# Patient Record
Sex: Male | Born: 1950 | Race: White | Hispanic: No | Marital: Married | Smoking: Never smoker
Health system: Southern US, Community
[De-identification: ages and names within clinical notes are randomized; demographics above are authoritative.]

## PROBLEM LIST (undated history)

## (undated) DIAGNOSIS — M1990 Unspecified osteoarthritis, unspecified site: Secondary | ICD-10-CM

## (undated) DIAGNOSIS — R7611 Nonspecific reaction to tuberculin skin test without active tuberculosis: Secondary | ICD-10-CM

## (undated) DIAGNOSIS — R0600 Dyspnea, unspecified: Secondary | ICD-10-CM

## (undated) DIAGNOSIS — I472 Ventricular tachycardia, unspecified: Secondary | ICD-10-CM

## (undated) DIAGNOSIS — C61 Malignant neoplasm of prostate: Secondary | ICD-10-CM

## (undated) DIAGNOSIS — N529 Male erectile dysfunction, unspecified: Secondary | ICD-10-CM

## (undated) DIAGNOSIS — K5790 Diverticulosis of intestine, part unspecified, without perforation or abscess without bleeding: Secondary | ICD-10-CM

## (undated) DIAGNOSIS — A809 Acute poliomyelitis, unspecified: Secondary | ICD-10-CM

## (undated) DIAGNOSIS — R251 Tremor, unspecified: Secondary | ICD-10-CM

## (undated) HISTORY — PX: HIP ARTHROPLASTY: SHX981

## (undated) HISTORY — DX: Unspecified osteoarthritis, unspecified site: M19.90

## (undated) HISTORY — PX: CATARACT EXTRACTION, BILATERAL: SHX1313

## (undated) HISTORY — DX: Ventricular tachycardia: I47.2

## (undated) HISTORY — DX: Dyspnea, unspecified: R06.00

## (undated) HISTORY — PX: ROBOT ASSISTED LAPAROSCOPIC RADICAL PROSTATECTOMY: SHX5141

## (undated) HISTORY — PX: TONSILLECTOMY: SUR1361

## (undated) HISTORY — DX: Acute poliomyelitis, unspecified: A80.9

## (undated) HISTORY — PX: CARDIAC ELECTROPHYSIOLOGY STUDY AND ABLATION: SHX1294

## (undated) HISTORY — DX: Male erectile dysfunction, unspecified: N52.9

## (undated) HISTORY — DX: Ventricular tachycardia, unspecified: I47.20

## (undated) HISTORY — PX: TOTAL HIP ARTHROPLASTY: SHX124

## (undated) HISTORY — DX: Tremor, unspecified: R25.1

## (undated) HISTORY — PX: CATARACT EXTRACTION: SUR2

## (undated) HISTORY — DX: Diverticulosis of intestine, part unspecified, without perforation or abscess without bleeding: K57.90

## (undated) HISTORY — PX: OTHER SURGICAL HISTORY: SHX169

## (undated) HISTORY — DX: Malignant neoplasm of prostate: C61

## (undated) HISTORY — DX: Nonspecific reaction to tuberculin skin test without active tuberculosis: R76.11

---

## 1968-07-18 DIAGNOSIS — R7611 Nonspecific reaction to tuberculin skin test without active tuberculosis: Secondary | ICD-10-CM

## 1968-07-18 HISTORY — DX: Nonspecific reaction to tuberculin skin test without active tuberculosis: R76.11

## 2007-01-15 ENCOUNTER — Encounter: Payer: Self-pay | Admitting: Gastroenterology

## 2007-01-15 ENCOUNTER — Encounter: Payer: Self-pay | Admitting: Pulmonary Disease

## 2007-01-16 ENCOUNTER — Encounter: Payer: Self-pay | Admitting: Pulmonary Disease

## 2007-01-22 ENCOUNTER — Encounter: Payer: Self-pay | Admitting: Pulmonary Disease

## 2007-01-30 ENCOUNTER — Ambulatory Visit: Payer: Self-pay | Admitting: Internal Medicine

## 2007-02-03 ENCOUNTER — Inpatient Hospital Stay (HOSPITAL_COMMUNITY): Admission: AD | Admit: 2007-02-03 | Discharge: 2007-02-06 | Payer: Self-pay | Admitting: Internal Medicine

## 2007-02-03 ENCOUNTER — Encounter: Payer: Self-pay | Admitting: Pulmonary Disease

## 2007-02-03 ENCOUNTER — Ambulatory Visit: Payer: Self-pay | Admitting: Internal Medicine

## 2007-02-26 ENCOUNTER — Ambulatory Visit: Payer: Self-pay | Admitting: Internal Medicine

## 2007-03-06 ENCOUNTER — Encounter: Payer: Self-pay | Admitting: Internal Medicine

## 2007-03-06 ENCOUNTER — Ambulatory Visit: Payer: Self-pay | Admitting: Internal Medicine

## 2007-03-06 ENCOUNTER — Ambulatory Visit: Payer: Self-pay

## 2007-04-05 ENCOUNTER — Ambulatory Visit: Payer: Self-pay | Admitting: Internal Medicine

## 2007-04-05 ENCOUNTER — Ambulatory Visit (HOSPITAL_COMMUNITY): Admission: RE | Admit: 2007-04-05 | Discharge: 2007-04-06 | Payer: Self-pay | Admitting: Internal Medicine

## 2007-08-10 ENCOUNTER — Ambulatory Visit: Payer: Self-pay | Admitting: Internal Medicine

## 2007-08-10 ENCOUNTER — Ambulatory Visit: Payer: Self-pay

## 2007-08-10 ENCOUNTER — Encounter: Payer: Self-pay | Admitting: Internal Medicine

## 2008-03-28 ENCOUNTER — Ambulatory Visit: Payer: Self-pay | Admitting: Internal Medicine

## 2008-03-28 ENCOUNTER — Ambulatory Visit: Payer: Self-pay

## 2008-03-28 ENCOUNTER — Encounter: Payer: Self-pay | Admitting: Internal Medicine

## 2008-07-18 DIAGNOSIS — C61 Malignant neoplasm of prostate: Secondary | ICD-10-CM

## 2008-07-18 HISTORY — DX: Malignant neoplasm of prostate: C61

## 2008-11-14 ENCOUNTER — Encounter: Payer: Self-pay | Admitting: Gastroenterology

## 2008-11-18 ENCOUNTER — Encounter: Payer: Self-pay | Admitting: Gastroenterology

## 2008-11-25 ENCOUNTER — Telehealth: Payer: Self-pay | Admitting: Internal Medicine

## 2008-12-04 ENCOUNTER — Encounter: Payer: Self-pay | Admitting: Gastroenterology

## 2008-12-05 ENCOUNTER — Encounter: Payer: Self-pay | Admitting: Gastroenterology

## 2008-12-08 ENCOUNTER — Telehealth: Payer: Self-pay | Admitting: Internal Medicine

## 2008-12-11 DIAGNOSIS — I428 Other cardiomyopathies: Secondary | ICD-10-CM | POA: Insufficient documentation

## 2008-12-11 DIAGNOSIS — E785 Hyperlipidemia, unspecified: Secondary | ICD-10-CM

## 2008-12-19 ENCOUNTER — Ambulatory Visit: Payer: Self-pay | Admitting: Internal Medicine

## 2008-12-19 DIAGNOSIS — C61 Malignant neoplasm of prostate: Secondary | ICD-10-CM

## 2008-12-19 DIAGNOSIS — I493 Ventricular premature depolarization: Secondary | ICD-10-CM | POA: Insufficient documentation

## 2008-12-31 ENCOUNTER — Telehealth (INDEPENDENT_AMBULATORY_CARE_PROVIDER_SITE_OTHER): Payer: Self-pay | Admitting: *Deleted

## 2009-01-01 ENCOUNTER — Encounter: Payer: Self-pay | Admitting: Internal Medicine

## 2009-01-01 ENCOUNTER — Ambulatory Visit: Payer: Self-pay

## 2009-01-05 ENCOUNTER — Telehealth: Payer: Self-pay | Admitting: Internal Medicine

## 2009-01-06 ENCOUNTER — Encounter: Payer: Self-pay | Admitting: Internal Medicine

## 2009-01-28 ENCOUNTER — Encounter: Payer: Self-pay | Admitting: Gastroenterology

## 2009-02-04 ENCOUNTER — Encounter: Payer: Self-pay | Admitting: Gastroenterology

## 2009-02-06 ENCOUNTER — Encounter: Payer: Self-pay | Admitting: Internal Medicine

## 2009-02-09 ENCOUNTER — Encounter: Payer: Self-pay | Admitting: Internal Medicine

## 2009-02-09 ENCOUNTER — Encounter: Payer: Self-pay | Admitting: Gastroenterology

## 2009-05-08 ENCOUNTER — Encounter: Payer: Self-pay | Admitting: Gastroenterology

## 2009-06-03 ENCOUNTER — Encounter: Payer: Self-pay | Admitting: Internal Medicine

## 2009-06-05 ENCOUNTER — Ambulatory Visit: Payer: Self-pay | Admitting: Internal Medicine

## 2009-06-05 DIAGNOSIS — I452 Bifascicular block: Secondary | ICD-10-CM

## 2009-06-16 ENCOUNTER — Telehealth (INDEPENDENT_AMBULATORY_CARE_PROVIDER_SITE_OTHER): Payer: Self-pay | Admitting: *Deleted

## 2009-06-17 ENCOUNTER — Encounter: Payer: Self-pay | Admitting: Internal Medicine

## 2009-06-17 ENCOUNTER — Encounter: Payer: Self-pay | Admitting: Pulmonary Disease

## 2009-06-17 LAB — CONVERTED CEMR LAB
Albumin: 3.8 g/dL
Alkaline Phosphatase: 102 units/L
CO2: 24 meq/L
Calcium: 9.3 mg/dL
Creatinine, Ser: 1.1 mg/dL
HCT: 44 %
Potassium: 4.4 meq/L
RDW: 14.1 %
Sodium: 140 meq/L
WBC: 6.4 10*3/uL

## 2009-06-18 ENCOUNTER — Telehealth: Payer: Self-pay | Admitting: Internal Medicine

## 2009-06-19 ENCOUNTER — Ambulatory Visit: Payer: Self-pay | Admitting: Pulmonary Disease

## 2009-06-19 DIAGNOSIS — Z8612 Personal history of poliomyelitis: Secondary | ICD-10-CM

## 2009-06-25 ENCOUNTER — Encounter: Payer: Self-pay | Admitting: Pulmonary Disease

## 2009-06-25 ENCOUNTER — Ambulatory Visit: Admission: RE | Admit: 2009-06-25 | Discharge: 2009-06-25 | Payer: Self-pay | Admitting: Pulmonary Disease

## 2009-06-26 ENCOUNTER — Ambulatory Visit (HOSPITAL_COMMUNITY): Admission: RE | Admit: 2009-06-26 | Discharge: 2009-06-26 | Payer: Self-pay | Admitting: Pulmonary Disease

## 2009-07-01 ENCOUNTER — Telehealth: Payer: Self-pay | Admitting: Internal Medicine

## 2009-07-02 ENCOUNTER — Telehealth: Payer: Self-pay | Admitting: Pulmonary Disease

## 2009-07-02 ENCOUNTER — Encounter: Payer: Self-pay | Admitting: Pulmonary Disease

## 2009-07-07 ENCOUNTER — Encounter (INDEPENDENT_AMBULATORY_CARE_PROVIDER_SITE_OTHER): Payer: Self-pay | Admitting: *Deleted

## 2009-07-07 ENCOUNTER — Telehealth: Payer: Self-pay | Admitting: Internal Medicine

## 2009-07-07 ENCOUNTER — Encounter: Payer: Self-pay | Admitting: Internal Medicine

## 2009-07-16 ENCOUNTER — Telehealth (INDEPENDENT_AMBULATORY_CARE_PROVIDER_SITE_OTHER): Payer: Self-pay | Admitting: *Deleted

## 2009-08-07 ENCOUNTER — Ambulatory Visit: Payer: Self-pay | Admitting: Gastroenterology

## 2009-08-07 ENCOUNTER — Encounter (INDEPENDENT_AMBULATORY_CARE_PROVIDER_SITE_OTHER): Payer: Self-pay | Admitting: *Deleted

## 2009-08-07 DIAGNOSIS — R142 Eructation: Secondary | ICD-10-CM

## 2009-08-07 DIAGNOSIS — K5900 Constipation, unspecified: Secondary | ICD-10-CM | POA: Insufficient documentation

## 2009-08-07 DIAGNOSIS — R141 Gas pain: Secondary | ICD-10-CM

## 2009-08-07 DIAGNOSIS — R143 Flatulence: Secondary | ICD-10-CM

## 2009-08-07 LAB — CONVERTED CEMR LAB
AST: 24 units/L (ref 0–37)
Albumin: 3.9 g/dL (ref 3.5–5.2)
Alkaline Phosphatase: 77 units/L (ref 39–117)
Amylase: 128 units/L (ref 27–131)
CO2: 29 meq/L (ref 19–32)
Eosinophils Relative: 1.7 % (ref 0.0–5.0)
Glucose, Bld: 94 mg/dL (ref 70–99)
HCT: 44.7 % (ref 39.0–52.0)
Monocytes Relative: 11.4 % (ref 3.0–12.0)
Neutrophils Relative %: 62.2 % (ref 43.0–77.0)
Platelets: 194 10*3/uL (ref 150.0–400.0)
Potassium: 4.1 meq/L (ref 3.5–5.1)
Saturation Ratios: 22 % (ref 20.0–50.0)
Sed Rate: 15 mm/hr (ref 0–22)
Sodium: 141 meq/L (ref 135–145)
Total Protein: 7.2 g/dL (ref 6.0–8.3)
WBC: 4.5 10*3/uL (ref 4.5–10.5)

## 2009-08-14 ENCOUNTER — Ambulatory Visit: Payer: Self-pay | Admitting: Pulmonary Disease

## 2009-08-21 ENCOUNTER — Encounter: Payer: Self-pay | Admitting: Gastroenterology

## 2009-08-28 ENCOUNTER — Ambulatory Visit: Payer: Self-pay | Admitting: Gastroenterology

## 2009-09-01 ENCOUNTER — Encounter: Payer: Self-pay | Admitting: Gastroenterology

## 2009-09-14 ENCOUNTER — Telehealth (INDEPENDENT_AMBULATORY_CARE_PROVIDER_SITE_OTHER): Payer: Self-pay | Admitting: *Deleted

## 2009-09-14 ENCOUNTER — Encounter: Payer: Self-pay | Admitting: Gastroenterology

## 2009-11-26 ENCOUNTER — Telehealth: Payer: Self-pay | Admitting: Internal Medicine

## 2009-12-03 ENCOUNTER — Telehealth: Payer: Self-pay | Admitting: Internal Medicine

## 2009-12-07 ENCOUNTER — Encounter: Payer: Self-pay | Admitting: Internal Medicine

## 2009-12-17 ENCOUNTER — Ambulatory Visit: Payer: Self-pay | Admitting: Internal Medicine

## 2009-12-24 ENCOUNTER — Telehealth: Payer: Self-pay | Admitting: Internal Medicine

## 2010-01-01 ENCOUNTER — Encounter: Payer: Self-pay | Admitting: Internal Medicine

## 2010-02-12 ENCOUNTER — Telehealth: Payer: Self-pay | Admitting: Internal Medicine

## 2010-07-01 ENCOUNTER — Encounter: Payer: Self-pay | Admitting: Internal Medicine

## 2010-07-01 ENCOUNTER — Ambulatory Visit: Payer: Self-pay | Admitting: Internal Medicine

## 2010-08-18 NOTE — Progress Notes (Signed)
Summary: c/o still having pvc   Phone Note Call from Patient Call back at Home Phone [phone]   Caller: Patient Reason for Call: Talk to Nurse Summary of Call: Per pt calling back to check on status of ekg that was faxed over on 5/12. c/o still having pvc.  Initial call taken by: Lorne Skeens,  Dec 03, 2009 10:12 AM  Follow-up for Phone Call        pt calling again, request call back,Jennifer Wilmon Pali  Dec 03, 2009 4:45 PM   per Dr Graciela Husbands he reviewed EKG pt did have some PVCs, ask how pt is feeling if is symptomatic can be seen at some point, called pt and left mess to call back Meredith Staggers, RN  Dec 03, 2009 5:13 PM   I attempted to call the pt. I left a message to c/b. Sherri Rad, RN, BSN  Dec 04, 2009 2:00 PM   The pt called back today. He states he feels "chronic fatigue" with his palpitations. This has been worse over the last 2 weeks. I explained Dr. Odessa Fleming findings on his EKG and that [name] would see him back in the office. The pt wonders if he just needs to wear a heart monitor again. He last wore one starting 06/26/09. I stated insurance may not pay for this until 6 months out from his previous monitor. I asked the pt if he is drinking any caffeine. He states he drinks a cup of caffeinated coffee every morning. I advised the pt to refrain from this over the weekend and then to call us on monday and let us know how he is feeling. He is agreeable with this. He did state that his bp is 130/80 and his HR is 60-70. He did explain that he has had problems with bradycardia on cardiac drugs in the past, but he does not recall what he took. When we see how he is monday, we will decide what the next step should be. Follow-up by: Sherri Rad, RN, BSN,  Dec 04, 2009 3:59 PM  Additional Follow-up for Phone Call Additional follow up Details #1::        I called the pt today to f/u on his symptoms. He was not home, but I spoke with his wife and she states he was still having some  symptoms. He may go to his PCP today during lunch to see if they will place a 24 hour holter on him. I advised the pt's wife to please have him call us if he has this placed. She verbalizes understanding.  Additional Follow-up by: Sherri Rad, RN, BSN,  Dec 07, 2009 11:41 AM    Additional Follow-up for Phone Call Additional follow up Details #2::    The pt called back. He states he is still having symptoms and he would like to wear a 24 hour holter. He states that Dr. Stephan Minister office will do this for him if we can send him an order. I will review w/ Amber-RN for [name] to see if this is ok. Dr. Stephan Minister office # is [phone]. Sherri Rad, RN, BSN  Dec 07, 2009 11:56 AM   Per Joice Lofts- ok to send order. Sherri Rad, RN, BSN  Dec 07, 2009 1:28 PM   Order faxed to Dr. Stephan Minister office at [phone] for a 24 hour holter. I called the pt at his work # and left a message that his order has been faxed to Dr. Stephan Minister office.  Follow-up by:  Sherri Rad, RN, BSN,  Dec 07, 2009 1:38 PM

## 2010-08-18 NOTE — Letter (Signed)
Summary: [facility]  [facility]   Imported By: Sherian Rein 09/07/2009 11:04:31  _____________________________________________________________________  External Attachment:    Type:   Image     Comment:   External Document

## 2010-08-18 NOTE — Medication Information (Signed)
Summary: 24 hr Holter Order  24 hr Holter Order   Imported By: Marylou Mccoy 01/19/2010 12:56:51  _____________________________________________________________________  External Attachment:    Type:   Image     Comment:   External Document

## 2010-08-18 NOTE — Letter (Signed)
Summary: [facility]  [facility]   Imported By: Sherian Rein 09/07/2009 11:01:32  _____________________________________________________________________  External Attachment:    Type:   Image     Comment:   External Document

## 2010-08-18 NOTE — Letter (Signed)
Summary: [facility]  [facility]   Imported By: Sherian Rein 09/07/2009 11:03:25  _____________________________________________________________________  External Attachment:    Type:   Image     Comment:   External Document

## 2010-08-18 NOTE — Miscellaneous (Signed)
Summary: Dexilant Rx  Clinical Lists Changes  Medications: Changed medication from DEXILANT 60 MG CPDR (DEXLANSOPRAZOLE) 1 by mouth qd to DEXILANT 60 MG CPDR (DEXLANSOPRAZOLE) 1 by mouth every morning - Signed Rx of DEXILANT 60 MG CPDR (DEXLANSOPRAZOLE) 1 by mouth every morning;  #30 x 6;  Signed;  Entered by: Jennye Boroughs RN;  Authorized by: Mardella Layman MD [facility];  Method used: Electronically to [facility] [phone]*, [address] [place], [place]  [address], Ph: [phone] or [phone], Fax: [phone]    Prescriptions: DEXILANT 60 MG CPDR (DEXLANSOPRAZOLE) 1 by mouth every morning  #30 x 6   Entered by:   Jennye Boroughs RN   Authorized by:   Mardella Layman MD [facility]   Signed by:   Jennye Boroughs RN on 08/28/2009   Method used:   Electronically to        [facility] [address] [phone]* (retail)       [address]       [place], [place]  [address]       Ph: [phone] or [phone]       Fax: [phone]   RxID:   [id number]

## 2010-08-18 NOTE — Letter (Signed)
Summary: [facility]  [facility]   Imported By: Sherian Rein 09/07/2009 11:05:51  _____________________________________________________________________  External Attachment:    Type:   Image     Comment:   External Document

## 2010-08-18 NOTE — Assessment & Plan Note (Signed)
Summary: PER CHECK OUT/SF   Visit Type:  Follow-up Referring Provider:  Berton Mount, Sheryn Bison Primary Provider:  Jerene Pitch, MD   CC:  None.  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 Take One Capsule Daily 2)  Dexilant 60 Mg Cpdr (Dexlansoprazole) .Marland Kitchen.. 1 By Mouth Every Morning 3)  Cialis 5 Mg Tabs (Tadalafil) .... As Needed  Allergies (verified): 1)  ! Sotalol Hcl 2)  ! Flecainide Acetate 3)  ! 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  Alcohol Use - no Positive PPD in the 1970's. Exposed in [place] Daily Caffeine Use: one daily   Vital Signs:  Patient profile:   60 year old male Height:      72 inches Weight:      204 pounds BMI:     27.77 Pulse rate:   67 / minute Pulse rhythm:   regular BP sitting:   120 / 80  (left arm) Cuff size:   regular  Vitals  Entered By: Judithe Modest CMA (December 17, 2009 1:00 PM)  Physical Exam  General:  The patient was alert and oriented in no acute distress. HEENT Normal.  Neck veins were flat, carotids were brisk.  Lungs were clear.  Heart sounds were regular without murmurs or gallops.  Abdomen was soft with active bowel sounds. There is no clubbing cyanosis or edema. Skin Warm and dry    EKG  Procedure date:  12/17/2009  Findings:      sinus with PVC  LBBB sup axis RBBB  Impression & Recommendations:  Problem # 1:  PREMATURE VENTRICULAR CONTRACTIONS S/P RFCA (ICD-427.69) He has recurrent PVC we will quantitate them via a monitor and consider referral to [facility]  for repeat RFCA    Problem # 2:  PALPITATIONS, RECURRENT (ICD-785.1) It is not clear the mechanism of htese tachypalps and tremors  we will use a patinet activeted montior  Problem # 3:  CARDIOMYOPATHY (ICD-425.4) resolved  Other Orders: EKG w/ Interpretation (93000)

## 2010-08-18 NOTE — Assessment & Plan Note (Signed)
Summary: f/u test results/pft and sniff test/LC   Copy to:  Berton Mount, Sheryn Bison Primary Provider/Referring Provider:  Jerene Pitch, MD   CC:  Patient here to follow-upon PFT and sniff test..  History of Present Illness: 60 yo male for follow up of dyspnea.  He has been feeling about the same.  He was recently seen by GI, and was started on a new anti-reflux medication.  He thinks this may have helped some, but he has only been on the new medication for about one week.  Chest xray report from 01/15/2007 negative, from 01/22/2007 negative, from 02/03/2007 negative.  Report CT chest with PE protocol from 01/16/2007 no PE, trace basilar atelectasis.  Chest xray from 06/17/2009 negative.  SNIFF test was normal.  PFT's were normal.  Current Medications (verified): 1)  Vitamin C Cr 500 Mg Cr-Caps (Ascorbic Acid) .... Take One Capsule Daily 2)  Dexilant 60 Mg Cpdr (Dexlansoprazole) .Marland Kitchen.. 1 By Mouth Qd 3)  Librax 2.5-5 Mg Caps (Clidinium-Chlordiazepoxide) .Marland Kitchen.. 1 By Mouth Three Times A Day Prn 4)  Cialis 5 Mg Tabs (Tadalafil) .Marland Kitchen.. 1 By Mouth Daily  Allergies (verified): 1)  ! Sotalol Hcl 2)  ! Flecainide Acetate 3)  ! * Rythmol  Past History:  Past Medical History: Reviewed history from 06/19/2009 and no changes required. 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 ablation radical prostate robotic cataract  Vital Signs:  Patient profile:   60 year old male Height:      72 inches (182.88 cm) Weight:      208.13 pounds (94.60 kg) BMI:     28.33 O2 Sat:      98 % on Room air Temp:     98.0  degrees F (36.67 degrees C) oral Pulse rate:   79 / minute BP sitting:   104 / 68  (left arm) Cuff size:   regular  Vitals Entered By: Michel Bickers CMA (August 14, 2009 2:50 PM)  O2 Sat at Rest %:  98 O2 Flow:  Room air  Physical Exam  General:  normal appearance and healthy appearing.   Nose:  no deformity, discharge, inflammation, or lesions Mouth:  no deformity or lesions Neck:  no masses, thyromegaly, or abnormal cervical nodes Lungs:  clear bilaterally to auscultation and percussion Heart:  regular rate and rhythm, S1, S2 without murmurs, rubs, gallops, or clicks, with occasional PVC's Abdomen:  bowel sounds positive; abdomen soft and non-tender without masses, or organomegaly Extremities:  no clubbing, cyanosis, edema, or deformity noted Cervical Nodes:  no significant adenopathy   Impression & Recommendations:  Problem # 1:  DYSPNEA-RECUMBENT (ICD-786.05)  His pulmonary evaluation has been unremarkable to date.  There is concern that he may be having reflux contributing to upper airway irritation and causing his dyspnea.  Will give more time with new anti-reflux medication, and reassess his symptoms status.  If he is no better will need CT chest imaging.  Medications Added to Medication List This Visit: 1)  Cialis 5 Mg Tabs (Tadalafil) .Marland Kitchen.. 1 by mouth daily  Complete Medication List: 1)  Vitamin C Cr 500 Mg Cr-caps (Ascorbic acid) .... Take one capsule daily 2)  Dexilant 60 Mg Cpdr (Dexlansoprazole) .Marland Kitchen.. 1 by  mouth qd 3)  Librax 2.5-5 Mg Caps (Clidinium-chlordiazepoxide) .Marland Kitchen.. 1 by mouth three times a day prn 4)  Cialis 5 Mg Tabs (Tadalafil) .Marland Kitchen.. 1 by mouth daily  Other Orders: Est. Patient Level III ([address])  Patient Instructions: 1)  Follow up in 3 months

## 2010-08-18 NOTE — Progress Notes (Signed)
Summary: Dexilant approval   Phone Note Outgoing Call   Call placed by: Ashok Cordia RN,  September 14, 2009 9:45 AM Summary of Call: Prior authorization needed for [employer].  Dexilant approved for one year.  Pharmacy notified.  LM for pt to call.  Initial call taken by: Ashok Cordia RN,  September 14, 2009 9:48 AM  Follow-up for Phone Call        LM that dexilant has been approved and to call if any questions. Follow-up by: Ashok Cordia RN,  September 14, 2009 4:25 PM

## 2010-08-18 NOTE — Cardiovascular Report (Signed)
Summary: [facility] Report  [facility] Report   Imported By: Kassie Mends 09/29/2009 09:46:45  _____________________________________________________________________  External Attachment:    Type:   Image     Comment:   External Document

## 2010-08-18 NOTE — Letter (Signed)
Summary: [facility]  [facility]   Imported By: Sherian Rein 09/07/2009 11:06:49  _____________________________________________________________________  External Attachment:    Type:   Image     Comment:   External Document

## 2010-08-18 NOTE — Procedures (Signed)
Summary: Summary Report  Summary Report   Imported By: Erle Crocker 02/25/2010 12:40:27  _____________________________________________________________________  External Attachment:    Type:   Image     Comment:   External Document

## 2010-08-18 NOTE — Letter (Signed)
Summary: [facility]  [facility]   Imported By: Sherian Rein 08/31/2009 10:31:49  _____________________________________________________________________  External Attachment:    Type:   Image     Comment:   External Document

## 2010-08-18 NOTE — Procedures (Signed)
Summary: Upper Endoscopy  Patient: Derek Cervantes Note: All result statuses are Final unless otherwise noted.  Tests: (1) Upper Endoscopy (EGD)   EGD Upper Endoscopy       DONE     [facility]     [address]     [place], [place]  [address]           ENDOSCOPY PROCEDURE REPORT           PATIENT:  [name], [name]  MR#:  [id number]     BIRTHDATE:  August 14, 1950, 58 yrs. old  GENDER:  male           ENDOSCOPIST:  Vania Rea. Jarold Motto, MD, [facility]     Referred by:           PROCEDURE DATE:  08/28/2009     PROCEDURE:  EGD with biopsy     ASA CLASS:  Class II     INDICATIONS:  hoarseness and chest pressure.R/O GERD           MEDICATIONS:   Fentanyl 50 mcg IV, Versed 5 mg IV     TOPICAL ANESTHETIC:  Exactacain Spray           DESCRIPTION OF PROCEDURE:   After the risks benefits and     alternatives of the procedure were thoroughly explained, informed     consent was obtained.  The Pentax [id number] endoscope was     introduced through the mouth and advanced to the second portion of     the duodenum, without limitations.  The instrument was slowly     withdrawn as the mucosa was fully examined.     <<PROCEDUREIMAGES>>           Normal duodenal folds were noted. NODULAR BULB C/W BRUNNER'S GLAND     HYPERPLASIA,BIOPSIES OF DONE OF SECOND PORTION OF DUODENUM.  The     stomach was entered and closely examined. The antrum, angularis,     and lesser curvature were well visualized, including a retroflexed     view of the cardia and fundus. The stomach wall was normally     distensable. The scope passed easily through the pylorus into the     duodenum. RANDOM BIOPSIES FOR H.PYLORI DONE.  The esophagus and     gastroesophageal junction were completely normal in appearance. ??     DISTAL WEB.MULTIPLE BIOPSIES DONE.    Retroflexed views revealed     no masses.    The scope was then withdrawn from the patient and     the procedure completed.           COMPLICATIONS:  None           ENDOSCOPIC  IMPRESSION:     1) Normal duodenal folds     2) Normal stomach     3) Normal esophagus     4) No masses     1.R/O CELIAC DISEASE     2.R/O EOSINOPHILIC ESOPHAGITIS.     3.PROBABLE GERD BETTER ON PPI RX.     RECOMMENDATIONS:     1) await biopsy results     2) continue current medications           REPEAT EXAM:  No           ______________________________     Vania Rea. Jarold Motto, MD, [facility]           CC:  Adele Barthel MD  n.     eSIGNED:   Vania Rea. Kemp Gomes at 08/28/2009 03:16 PM           Dionisio Kerrie Latour, [id number]  Note: An exclamation mark (!) indicates a result that was not dispersed into the flowsheet. Document Creation Date: 08/28/2009 3:16 PM _______________________________________________________________________  (1) Order result status: Final Collection or observation date-time: 08/28/2009 15:03 Requested date-time:  Receipt date-time:  Reported date-time:  Referring Physician:   Ordering Physician: Sheryn Bison [phone]) Specimen Source:  Source: Launa Grill Order Number: [id number] Lab site:

## 2010-08-18 NOTE — Letter (Signed)
Summary: [facility]  [facility]   Imported By: Sherian Rein 09/07/2009 11:02:30  _____________________________________________________________________  External Attachment:    Type:   Image     Comment:   External Document

## 2010-08-18 NOTE — Assessment & Plan Note (Signed)
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 He is doing well from a cardiac standpoint and is on no antiarrhythmic medications. His main complaint today is 2 years of postprandial bloating with vague recurrent discomfort in his substernal area without true reflux symptoms. He recently has noticed some hoarseness ,and he has episodes of stress induced tremors in his chest and abdomen with associated shortness of breath suggestive of possible panic attacks. He does admit that he has been very stressed by recent robotic surgery for stage III prostate carcinoma apparently without evidence of metastasis per CT scan. Apparently his surgery was performed in [facility], and he relates his CT scan of the abdomen was performed and was normal. He denies burning substernal chest pain, regurgitation, or dysphagia. He denies hepatobiliary complaints or history of hepatitis or pancreatitis. He is having regular bowel movements without melena or hematochezia. He had a negative screening colonoscopy by Dr. Tressia Danas in [place] in 2007. Apparently he did have diverticulosis noted. He denies melena, hematochezia, any history of hepatitis, anemia, or systemic complaints such as fever, chills, skin rashes, joint pains et Karie Soda. He denies clinically significant depression. He has not had previous endoscopic exam or barium studies   GI Review of Systems    Reports abdominal pain and   bloating.     Location of  Abdominal pain: epigastric area.    Denies acid reflux, belching, chest pain, dysphagia with liquids, dysphagia with solids, heartburn, loss of appetite, nausea, vomiting, vomiting blood, weight loss, and  weight gain.      Reports constipation.     Denies anal fissure, black tarry stools, change in bowel habit, diarrhea, diverticulosis, fecal incontinence, heme positive stool, hemorrhoids, irritable bowel syndrome, jaundice, light color stool, liver problems, rectal bleeding, and  rectal pain.    Current Medications (verified): 1)  Vitamin C Cr 500 Mg Cr-Caps (Ascorbic Acid) .... Take One Capsule Daily 2)  Prilosec Otc 20 Mg Tbec (Omeprazole Magnesium) .... Take 1 Tablet By Mouth Once A Day As Needed  Allergies (verified): 1)  ! Sotalol Hcl 2)  ! Flecainide Acetate 3)  ! * Rythmol  Past History:  Past medical, surgical, family and social histories (including risk factors) reviewed for relevance to current acute and chronic problems.  Past Medical History: Reviewed history from 06/19/2009 and no changes required. Idiopathic VT (ventricular tachycardia) status post ablation at the [facility] clinic Cardiomyopathy - PVC (premature ventricular contraction) related prostate cancer status post robotic surgery-stage III Recumbent dyspnea Hand tremors unassociated with palpitations Polio as  a child Diverticulosis PPD in 1970's Erectile dysfunction  Past Surgical History: Reviewed history from 06/19/2009 and no changes required. ablation radical prostate robotic cataract  Family History: Reviewed history from 12/11/2008 and no changes required. MGM died of CAD in her 85s Bladder Cancer: Father  No FH of Colon Cancer:  Social History: Reviewed history from 06/19/2009 and no changes required. Full Time-Dentist Married Childern Tobacco Use - No.  Alcohol Use - no Positive PPD in the 1970's. Exposed in  [place] Daily Caffeine Use: one daily   Review  of Systems General:  Complains of fatigue and sleep disorder; denies fever, chills, sweats, anorexia, weakness, malaise, and weight loss. Eyes:  Denies blurring, diplopia, irritation, discharge, vision loss, scotoma, eye pain, and photophobia. ENT:  Complains of hoarseness; denies earache, ear discharge, tinnitus, decreased hearing, nasal congestion, loss of smell, nosebleeds, sore throat, and difficulty swallowing. CV:  Complains of chest pains and palpitations; denies angina, syncope, dyspnea on exertion, orthopnea, PND, peripheral edema, and claudication; Cardiac ablation in September 2008 in July 2009. He been followed by [name] and our cardiology division.Marland Kitchen Resp:  Complains of dyspnea with exercise; denies dyspnea at rest, cough, sputum, wheezing, coughing up blood, and pleurisy; He Recently had been evaluated by pulmonary division and had normal pulmonary function test. There was some question of reactive airways disease. However, there is no evidence of restrictive disease or severe asthma.. GI:  Complains of constipation; denies difficulty swallowing, pain on swallowing, nausea, indigestion/heartburn, vomiting, vomiting blood, abdominal pain, jaundice, gas/bloating, diarrhea, change in bowel habits, bloody BM's, black BMs, and fecal incontinence. GU:  Complains of urinary incontinence; denies urinary burning, blood in urine, urinary frequency, urinary hesitancy, nocturnal urination, penile discharge, genital sores, decreased libido, and erectile dysfunction; prostatectomy in July 2010 by robotic surgery.. MS:  Denies joint pain / LOM, joint swelling, joint stiffness, joint deformity, low back pain, muscle weakness, muscle cramps, muscle atrophy, leg pain at night, leg pain with exertion, and shoulder pain / LOM hand / wrist pain (CTS). Derm:  Denies rash, itching, dry skin, hives, moles, warts, and unhealing ulcers. Neuro:  Denies weakness, paralysis, abnormal sensation, seizures, syncope,  tremors, vertigo, transient blindness, frequent falls, frequent headaches, difficulty walking, headache, sciatica, radiculopathy other:, restless legs, memory loss, and confusion; He apparently had polio as a child but has had no subsequent neuromuscular difficulties.Marland Kitchen Psych:  Complains of anxiety; denies depression, memory loss, suicidal ideation, hallucinations, paranoia, phobia, and confusion. Endo:  Denies cold intolerance, heat intolerance, polydipsia, polyphagia, polyuria, unusual weight change, and hirsutism. Heme:  Denies bruising, bleeding, enlarged lymph nodes, and pagophagia. Allergy:  Denies hives, rash, sneezing, hay fever, and recurrent infections.  Vital Signs:  Patient profile:   60 year old male Height:      72 inches Weight:      201 pounds BMI:     27.36 BSA:     2.14 Pulse rate:   72 / minute Pulse rhythm:   regular BP sitting:   118 / 80  (left arm) Cuff size:   regular  Vitals Entered By: Ok Anis CMA (August 07, 2009 9:45 AM)  Physical Exam  General:  Well developed, well nourished, no acute distress.healthy appearing.   Head:  Normocephalic and atraumatic. Eyes:  PERRLA, no icterus.exam deferred to patient's ophthalmologist.   Mouth:  No deformity or lesions, dentition normal. Neck:  Supple; no masses or thyromegaly. Lungs:  Clear throughout to auscultation. Heart:  Regular rate and rhythm; no murmurs, rubs,  or bruits.He appears be a regular rhythm without ectopic beats noted at this time Abdomen:  Soft, nontender and nondistended. No masses, hepatosplenomegaly or hernias noted. Normal bowel sounds. Rectal:  deferred Msk:  Symmetrical with no gross deformities. Normal posture. Pulses:  Normal pulses noted. Extremities:  No clubbing, cyanosis, edema or deformities noted. Neurologic:  Alert and  oriented x4;  grossly normal neurologically. Cervical Nodes:  No significant cervical adenopathy. Axillary Nodes:  No significant axillary adenopathy. Inguinal  Nodes:  No significant  inguinal adenopathy. Psych:  Alert and cooperative. Normal mood and affect.   Impression & Recommendations:  Problem # 1:  ABDOMINAL BLOATING (ICD-787.3) Assessment Unchanged Probable IBS with an element of delayed gastric emptying. We had scheduled endoscopy to exclude H. pylori infection. Screening labs have also been requested. I placed him on p.r.n. Librax pending further workup. Orders: TLB-CBC Platelet - w/Differential (85025-CBCD) TLB-BMP (Basic Metabolic Panel-BMET) (80048-METABOL) TLB-Hepatic/Liver Function Pnl (80076-HEPATIC) TLB-TSH (Thyroid Stimulating Hormone) (84443-TSH) TLB-B12, Serum-Total ONLY (16109-U04) TLB-Ferritin (82728-FER) TLB-Folic Acid (Folate) (82746-FOL) TLB-IBC Pnl (Iron/FE;Transferrin) (83550-IBC) TLB-Amylase (82150-AMYL) TLB-Lipase (83690-LIPASE) TLB-Sedimentation Rate (ESR) (85652-ESR) EGD (EGD)  Problem # 2:  CONSTIPATION (ICD-564.00) Assessment: Unchanged High fiber diet as tolerated with liberal p.o. fluids  Problem # 3:  POSITIVE PPD (ICD-795.5) Assessment: Comment Only No evidence of active tuberculosis.  Problem # 4:  POLIOMYELITIS, HX OF (ICD-V12.02) Assessment: Comment Only No Evidence of diffuse neuromuscular disease at this time.  Problem # 5:  DYSPNEA-RECUMBENT (ICD-786.05) Assessment: Improved Possibly related to occult acid reflux as is his complaint of new onset hoarseness. I placed him on BETTER ACID SUPPRESSION WITH Dexilant 60 mg 30 minutes before breakfast along with standard anti-reflux maneuvers. Again, endoscopic exam with esophageal, gastric, and duodenal biopsies has been scheduled  Problem # 6:  ADENOCARCINOMA, PROSTATE (ICD-185) Assessment: Improved Continued urologic followup as scheduled  Problem # 7:  PREMATURE VENTRICULAR CONTRACTIONS S/P RFCA (ICD-427.69) Assessment: Improved Continued cardiology followup as scheduled  Patient Instructions: 1)  Copy sent to : Dr. Adele Barthel and Dr.  Sherryl Manges and Dr. Craige Cotta in pulmonary 2)  Please continue current medications.  3)  Avoid foods high in acid content ( tomatoes, citrus juices, spicy foods) . Avoid eating within 3 to 4 hours of lying down or before exercising. Do not over eat; try smaller more frequent meals. Elevate head of bed four inches when sleeping.  4)  Excessive Gas Diet handout given.  5)  Conscious Sedation brochure given.  6)  Upper Endoscopy brochure given.  7)  Dexilant 60 mg 30 minutes before breakfast along with p.r.n. Librax use. 8)  Diet should be high in fiber ( fruits, vegetables, whole grains) but low in residue. Drink at least eight (8) glasses of water a day.  9)  Labs Pending 10)  The medication list was reviewed and reconciled.  All changed / newly prescribed medications were explained.  A complete medication list was provided to the patient / caregiver. Prescriptions: LIBRAX 2.5-5 MG CAPS (CLIDINIUM-CHLORDIAZEPOXIDE) 1 by mouth three times a day prn  #60 x 2   Entered by:   Ashok Cordia RN   Authorized by:   Mardella Layman MD [facility]   Signed by:   Ashok Cordia RN on 08/07/2009   Method used:   Electronically to        [facility] [phone] (retail)       [address]       [place], [place]  [address]       Ph: [phone] or [phone]       Fax: [phone]   RxID:   [id number]

## 2010-08-18 NOTE — Letter (Signed)
Summary: EGD Instructions  [facility]  [address] [place], [place] [address]   Phone: [phone]  Fax: [phone]       Derek Cervantes    03/11/1951    MRN: [id number]       Procedure Day /Date: Friday, 08/28/09     Arrival Time:  2:00     Procedure Time: 3:00     Location of Procedure:                    [facility] (4th Floor)    PREPARATION FOR ENDOSCOPY   On 08/28/09 THE DAY OF THE PROCEDURE:  1.   No solid foods, milk or milk products are allowed after midnight the night before your procedure.  2.   Do not drink anything colored red or purple.  Avoid juices with pulp.  No orange juice.  3.  You may drink clear liquids until 1:00, which is 2 hours before your procedure.                                                                                                CLEAR LIQUIDS INCLUDE: Water Jello Ice Popsicles Tea (sugar ok, no milk/cream) Powdered fruit flavored drinks Coffee (sugar ok, no milk/cream) Gatorade Juice: apple, white grape, white cranberry  Lemonade Clear bullion, consomm, broth Carbonated beverages (any kind) Strained chicken noodle soup Hard Candy   MEDICATION INSTRUCTIONS  Unless otherwise instructed, you should take regular prescription medications with a small sip of water as early as possible the morning of your procedure.                     OTHER INSTRUCTIONS  You will need a responsible adult at least 62 years of age to accompany you and drive you home.   This person must remain in the waiting room during your procedure.  Wear loose fitting clothing that is easily removed.  Leave jewelry and other valuables at home.  However, you may wish to bring a book to read or an iPod/MP3 player to listen to music as you wait for your procedure to start.  Remove all body piercing jewelry and leave at home.  Total time from sign-in until discharge is approximately 2-3 hours.  You should go  home directly after your procedure and rest.  You can resume normal activities the day after your procedure.  The day of your procedure you should not:   Drive   Make legal decisions   Operate machinery   Drink alcohol   Return to work  You will receive specific instructions about eating, activities and medications before you leave.    The above instructions have been reviewed and explained to me by   _______________________    I fully understand and can verbalize these instructions _____________________________ Date _________

## 2010-08-18 NOTE — Progress Notes (Signed)
Summary: checking on monitor report  Phone Note Call from Patient Call back at Home Phone [phone] Call back at Work Phone [phone]   Caller: Patient Reason for Call: Talk to Nurse Details for Reason: checking on monitor report.  Initial call taken by: Lorne Skeens,  February 12, 2010 11:07 AM  Follow-up for Phone Call        Unable to return call b/c home # was on fax mode and his office is closed today. Lisabeth Devoid RN  Tried calling again but home phone was on fax mode. Lisabeth Devoid RN   lmtcb Scherrie Bateman, LPN  February 15, 2010 2:29 PM PT AWARE WILL CALL BACK ONCE MONITOR REVIEWED BY DR Graciela Husbands. Follow-up by: Scherrie Bateman, LPN,  February 16, 2010 9:13 AM  Additional Follow-up for Phone Call Additional follow up Details #1::        symptoms assoc with inorder of freqeuncy sinus, PACs and PVCs in that order --nothing really to do right now did not left message on answeing machine Additional Follow-up by: Nathen May, MD, [facility],  February 22, 2010 1:54 PM    Additional Follow-up for Phone Call Additional follow up Details #2::    spoke with pt, he is aware of monitor results. Deliah Goody, RN  February 23, 2010 10:51 AM

## 2010-08-18 NOTE — Progress Notes (Signed)
Summary: checking on monitor  Phone Note Call from Patient Call back at [phone]   Caller: Spouse Reason for Call: Talk to Nurse Summary of Call: calling checking on monitor Initial call taken by: Migdalia Dk,  December 24, 2009 8:45 AM  Follow-up for Phone Call        Pt's wife called regarding  a heart monitor Dr. Marikay Alar mention to pt. on clinic visit on 12/17/09 . I let wife know I  will call her back asa find out. Ollen Gross, RN, BSN  December 24, 2009 9:21 AM Spoke with Windell Moulding which states pt. needs a ACt  event monitor. Order was put in EMR.  Monitor will be mail to pt. by Windell Moulding. Pt's wife agreed for monitor  to be mail  to pt.   Follow-up by: Ollen Gross, RN, BSN,  December 24, 2009 10:30 AM

## 2010-08-18 NOTE — Letter (Signed)
Summary: Patient [facility] Biopsy Results   Gastroenterology  [address] [place], [place] [address]   Phone: [phone]  Fax: [phone]        September 01, 2009 MRN: [id number]    Derek Cervantes [address] [place], [place]  [address]    Dear [name],  I am pleased to inform you that the biopsies taken during your recent endoscopic examination did not show any evidence of cancer upon pathologic examination.Esophageal biopsies showed no evidence of eosinophilic esophagitis. Gastric biopsies showed no evidence of H. pylori. Duodenal biopsies showed no evidence of celiac disease.  Additional information/recommendations:  __No further action is needed at this time.  Please follow-up with      your primary care physician for your other healthcare needs.  __ Please call [phone] to schedule a return visit to review      your condition.  x__ Continue with the treatment plan as outlined on the day of your      exam.I would advise continued reflux treatment as outlined for several months so we can see how you do symptomatically with good gastroesophageal reflux control. Please call me if you have any further questions or problems.  __ You should have a repeat endoscopic examination for this problem              in _ months/years.   Please call us if you are having persistent problems or have questions about your condition that have not been fully answered at this time.  Sincerely,  Mardella Layman MD [facility]  This letter has been electronically signed by your physician.  Appended Document: Patient Notice-Endo Biopsy Results Letter mailed 2.16.11

## 2010-08-18 NOTE — Medication Information (Signed)
Summary: Approved/[employer]  Approved/[employer]   Imported By: Lester Page 09/18/2009 09:40:36  _____________________________________________________________________  External Attachment:    Type:   Image     Comment:   External Document

## 2010-08-18 NOTE — Letter (Signed)
Summary: [facility]  [facility]   Imported By: Sherian Rein 09/07/2009 11:15:01  _____________________________________________________________________  External Attachment:    Type:   Image     Comment:   External Document

## 2010-08-18 NOTE — Medication Information (Signed)
Summary: Dexilant Approved/[employer]  Dexilant Approved/[employer]   Imported By: Sherian Rein 10/09/2009 14:17:06  _____________________________________________________________________  External Attachment:    Type:   Image     Comment:   External Document

## 2010-08-18 NOTE — Progress Notes (Signed)
Summary: ekg  Phone Note Call from Patient Call back at Home Phone [phone] Call back at [phone]   Caller: Spouse Summary of Call: Dr Reinaldo Raddle is going to fax over ekg done today.... having pcv's Initial call taken by: Migdalia Dk,  Nov 26, 2009 2:48 PM  Follow-up for Phone Call        Left message to call back Meredith Staggers, RN  Nov 26, 2009 5:50 PM   returning call, Migdalia Dk  Nov 27, 2009 8:34 AM  11/27/09--9am--received EKG from dr pryor's office--will have dr Graciela Husbands review and will notify mr Hymas with dr Odessa Fleming advice--nt Follow-up by: Ledon Snare, RN,  Nov 27, 2009 9:01 AM

## 2010-08-19 NOTE — Assessment & Plan Note (Signed)
Summary: Z3Y   Visit Type:  6 month follow up Referring Bailley Guilford:  Berton Mount, Sheryn Bison Primary Nicolis Boody:  Jerene Pitch, MD   CC:  no complaints.  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 At this point it occurs in clusters typically 3-4 hours once a month or so. He also has occasional brief episodes when he lays down to go to sleep at night. There is no significant impairment of exercise tolerance. 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 Take One Capsule Daily 2)  Cialis 5 Mg Tabs (Tadalafil) .... As Needed  Allergies (verified): 1)  ! Sotalol Hcl 2)  ! Flecainide Acetate 3)  ! 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 at the [facility] clinic Cardiomyopathy - PVC (premature ventricular contraction) related prostate cancer status post robotic surgery-stage III Recumbent dyspnea Hand tremors unassociated with palpitations Polio as  a child Diverticulosis PPD in 1970's Erectile dysfunction  Past Surgical History: Last updated: 06/19/2009 ablation radical prostate robotic cataract  Family History: Last updated: 09/05/2009 MGM died of CAD in her  88s Bladder Cancer: Father  No FH of Colon Cancer:  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  Alcohol Use - no Positive PPD in the 1970's. Exposed in [place] Daily Caffeine Use: one daily   Risk Factors: Alcohol Use: 0 (06/19/2009)  Risk Factors: Smoking Status: never (06/19/2009)  Vital Signs:  Patient profile:   60 year old male Height:      72 inches  Vitals Entered By: Caralee Ates CMA (July 01, 2010 3:32 PM)  Physical Exam  General:  The patient was alert and oriented in no acute distress. HEENT Normal.  Neck veins were flat, carotids were brisk.  Lungs were clear.  Heart sounds were regular without murmurs or gallops.  Abdomen was soft with active bowel sounds. There is no clubbing cyanosis or edema. 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 I have encouraged her to think about using p.r.n. benzodiazepines. In addition I suggested he go to bed allow his right side. For right now we'll hold off any furthree intervention  Patient Instructions: 1)  Your physician recommends that you continue on your current medications as directed. Please refer to the Current Medication list given to you today. 2)  Your physician wants you to follow-up in:  6 months You will receive a reminder letter in the mail two months in advance. If you don't receive a letter, please call our office to schedule the follow-up appointment.

## 2010-11-30 NOTE — Letter (Signed)
August 10, 2007    Dr. Adele Barthel  [facility]  [address]  [place], [place] [address]   Dear Dr. Reinaldo Raddle:   RE:  Derek Cervantes, Derek Cervantes  MRN:  [id number]  /  DOB:  [birth year]   Derek Cervantes came in today with his wife feeling much, much better since  his PVCs resolved at the end of October.  Clearly this was spontaneous,  although it would be wonderful to take some credit for delayed healing  following our ablation procedure but I do not think that that is at all  likely.  Because of that I think there is some potential for recurrence.   He continues to complain of fatigue, although this is much less  problematic now than it was even a month ago.  There is no accompanying  chest discomfort.   An echo done today demonstrated normalization of LV systolic function.   His only medication currently is aspirin.  His blood pressure is 108/82.  His pulse was 70.  His lungs were clear.  Heart sounds were regular.  Extremities were without edema.   IMPRESSION:  1. Idiopathic VT (ventricular tachycardia) with spontaneous      resolution.  2. Cardiomyopathy - PVC (premature ventricular contraction) related,      now resolved.   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  We will plan to see him again in 6 months'  time.  He is to call if there is any plateauing or worsening in his  functional status.    Sincerely,      Derek Salvia, MD, [facility]  Electronically Signed    SCK/MedQ  DD: 08/10/2007  DT: 08/10/2007  Job #: [id number]   CC:    Leafy Ro, Dr

## 2010-11-30 NOTE — Op Note (Signed)
[name], [name] NO.:  [id number]   MEDICAL RECORD NO.:  [id number]          PATIENT TYPE:  OIB   LOCATION:  4707                         FACILITY:  [facility]   PHYSICIAN:  Derek Salvia, MD, FACCDATE OF BIRTH:  [birth year]   DATE OF PROCEDURE:  04/05/2007  DATE OF DISCHARGE:  04/06/2007                               OPERATIVE REPORT   PREOPERATIVE DIAGNOSIS:  __________  ventricular ectopy.   POSTOPERATIVE DIAGNOSIS:  __________  ventricular ectopy.   PROCEDURE:  Invasive electrophysiological study with electroanatomical  mapping.   This is an abbreviated note.  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  We used an 8 [place] 5 mm deflectable tip ablation catheter  with a 4 French quadripolar catheter at the AV junction and an  endocardial array in the left femoral vein for mapping.   The patient was in sinus rhythm with an RR interval of 713 milliseconds  with PR interval 164 with a QRS duration of 73 __________  355 and a P  wave duration of 79 milliseconds.   A total of 11 minutes and 31 seconds of RF energy was applied at sites  where the earliest electrogram was identified which was -18 milliseconds  prior to the onset of __________ QRS.  We were able to generate  tachycardia but were unable to illuminate the VR.  These lesions were  delivered in the vicinity of the tricuspid valve.  A total of 26  __________ at 51 seconds of fluoroscopy time was utilized in the RAO and  the LAO projection.   Following the procedure the catheters were removed, hemostasis was  obtained and the patient was transferred to the holding area in stable  condition.   IMPRESSION:  Unsuccessful ablation of ventricular ectopy arising near  the region of the tricuspid valve.      Derek Salvia, MD, [facility]  Electronically Signed     SCK/MEDQ  D:  09/14/2007  T:  09/16/2007  Job:  [id number]

## 2010-11-30 NOTE — Discharge Summary (Signed)
Derek Cervantes, Derek Cervantes NO.:  [id number]   MEDICAL RECORD NO.:  [id number]          PATIENT TYPE:  INP   LOCATION:  2038                         FACILITY:  [facility]   PHYSICIAN:  Duke Salvia, MD, FACCDATE OF BIRTH:  October 31, 1950   DATE OF ADMISSION:  02/03/2007  DATE OF DISCHARGE:  02/06/2007                               DISCHARGE SUMMARY   FINAL DIAGNOSES:  1. Admitted with palpitations associated with lightheadedness, nausea,      weakness.  2. 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   SECONDARY DIAGNOSES:  1. 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      a.     Metoprolol therapy discontinued secondary to hypertension.      b.     On July 2, a stress test and echocardiogram were negative.      c.     Admitted to [facility] July 10 for persistent       palpitations/fatigue/weakness, started on Rythmol 225 mg twice       daily.  2. Testing at [facility] to rule out arrhythmogenic right      ventricular dysplasia, the more malignant process, from benign      ventricular ectopy.      a.     Signal-averaged electrocardiogram positive two out of three       but does not meet criteria for arrhythmogenic right ventricular       dysplasia.  3. Cardiac magnetic resonance imaging done to rule out fibrofatty      infiltrates negative, ejection fraction 45%.   PROCEDURES:  Done February 05, 2007, exercise treadmill study to rule out  proarrhythmias on Rythmol.  The patient walked 11 minutes 15 seconds on  a Bruce protocol to maximum heart rate 157, no acute EKG changes.  PVCs  resolved with exercise.  There were rare PVCs with recovery.  No  proarrhythmic effects from Rythmol.  His Rythmol dose has been increased  from 225 mg b.i.d. of 325 mg b.i.d.  The patient is also on verapamil CD  120 mg daily.   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  He has a 2-3 week  history of palpitations.  They are associated with lightheadedness,  nausea, weakness.  They have become progressively debilitating.  He has  been tried on beta-blockers and taken off them secondary to hypotension.  He was transferred to [facility] when his symptoms  persisted and interfered with his ability to work effectively.  He had a  negative stress test echocardiogram.  During the workup he was seen by  Dr. Luella Cook at [facility]  He was started on flecainide but this was  discontinued secondary to diarrhea.  He was then started on Rythmol 225  mg twice daily and has felt a good deal better.   Inspection of the electrocardiogram shows that PVCs occur with a left  bundle superior axis morphology, suggesting a  right inferior ventricular  origin.  Dr. Lorelle Formosa has significantly symptomatic ventricular ectopy  which emerges from the low right ventricle.  The differential diagnosis  is a benign ventricular ectopy versus arrhythmogenic, RV dysplasia.  Workup would include signal-averaged electrocardiogram and cardiac MRI.  This is to help differentiate ARVD from the midline ventricular ectopy.  ARVD is a much more malignant process.  From a symptomatic point of  view, the patient is doing well on Rythmol after only two doses.  We  could up-titrate Rythmol to a total of 425 mg b.i.d. as necessary.  We  will assess a risk of arrhythmias by treadmill testing 72 hours after  initiation of the drug.  The kinetics of the drug are most prominently  seen at high heart rates.   Is also been described that patients with right ventricular outflow  tract and ventricular ectopy could have spontaneous remission.  This  could happen in as many as 20-30% of patients, so at some point in the  next couple of months we could consider back-titration of medication and  in the event he remains asymptomatic, he could have a Holter monitor  check.  If this  proves no further ventricular ectopy, then his  medication could be discontinued.   At Present with frequent ventricular ectopy, 20% of his total beat  burden is taken with these ectopic beats.  This could be associated with  cardiomyopathy.  If he developed progressive cardiomyopathic changes,  tentative therapy with ablation would be a preferred alternative.  The  patient will be admitted for testing and medical management.   HOSPITAL COURSE:  The patient presented electively on July 19.  His  Rythmol was up-titrated to 325 mg twice daily.  He had an exercise  treadmill study on July 21 which showed no acute EKG changes, and PVCs  resolved with exercise.  Signal-averaged EKG did not test positive for  criteria indicating ARVD.  His cardiac MRI showed global right  ventricular, left ventricular hypokinesis with ejection fraction of 45%.  The plan at discharge will be to take the patient home on verapamil CD  120 mg daily.  He will have a Holter monitor study in 3 weeks.  The  Holter monitor will be mailed to the patient at about August 11.  He  will follow up with [name] on August 19 at 11:30 for a 2-D  echocardiogram and to see [name] at 12:20.   LABORATORY STUDIES THIS ADMISSION:  Serum electrolytes:  Sodium 139,  potassium 3.9, chloride 107, carbonate 25, BUN is 14, creatinine 1.02,  glucose is 130.  Pro time is 13.0, INR 1.0, PTT is 26.  Alkaline  phosphatase 68, SGOT 20, SGPT 25.  Serum troponin I studies are 0.01 x3.  Total cholesterol 194, LDL cholesterol 138, HDL cholesterol 39,  triglycerides 84.  Magnesium is 2.1.  The TSH this admission 1.192.   Greater than 40 minutes for this discharge dictation.      Maple Mirza, [place]      Duke Salvia, MD, [facility]  Electronically Signed    GM/MEDQ  D:  02/06/2007  T:  02/07/2007  Job:  [id number]   cc:   Adele Barthel, MD  Luella Cook, MD

## 2010-11-30 NOTE — Letter (Signed)
March 06, 2007    Adele Barthel, M.D.  [facility] - [address]  [place], [place] [address]   RE:  OBIE, KALLENBACH  MRN:  [id number]  /  DOB:  April 26, 1951   Dear Dr. Reinaldo Raddle:   It was a pleasure to see your patient, Dr. Dionisio David, today.  According  to both him and his wife, he continues to have a modest amount of  ventricular ectopy although this seems to be somewhat improved.  More  notably, he has progressive lethargy, fatigue, irritability, and  problems sleeping with suggestions of depression.  Part of this relates  to his anticipation of this recurrent ventricular ectopy.  As you  recall, his evaluation for arrhythmogenic RV dysplasia was  nondefinitive.  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   His medications currently include Rythmol 325 mg b.i.d. and aspirin.   PHYSICAL EXAMINATION:  VITAL SIGNS:  Blood pressure 102/64, pulse 44.  LUNGS:  Clear.  HEART:  Heart sounds regular.  EXTREMITIES:  Without edema.   Preliminary echo from today demonstrated near normal left ventricular  function and moderate depression of RV systolic function.   IMPRESSION:  1. Ventricular ectopy with a left bundle superior rectus morphology      and averted broad QRS.  2. Cardiomyopathy, question related to #1; i.e. which is the cause and      which is the effect.  3. Nondiagnostic evaluation for arrhythmogenic right ventricular      dysplasia.  4. Fatigue and lethargy, question related to the medications.   Dr. Reinaldo Raddle, I will plan to try to quantitate Dr. Kenna Gilbert ventricular  ectopy burden.  We will use this to help US guide therapy.   It is not clear whether the Rythmol is contributing to his symptoms; I  think that it is.  An alternative drug would be to use Sotalol.  We  frequently start this in hospital although some people  start it as an  outpatient.  I will look forward to talking with you about this if this  is the course there are we choose to pursue.   We also spent time extensively discussing catheter ablation as an  alternative.  Unclear the potential benefits as well as potential risks  including but not limited to death, perforation, and iatrogenic heart  block.   For right now what we plan to do is:  1. Obtain a 24-hour Holter monitor.  2. Make a decision about ongoing drug therapy.  3. Reassess a Holter monitor in about two months with a repeat echo at      that time.  4. Make a decision at that time regarding catheter ablation.   Thank you very much.    Sincerely,      Duke Salvia, MD, [facility]  Electronically Signed    SCK/MedQ  DD: 03/06/2007  DT: 03/07/2007  Job #: [id number]

## 2010-11-30 NOTE — Discharge Summary (Signed)
NAMESHAHID, FLORI                  ACCOUNT NO.:  [id number]   MEDICAL RECORD NO.:  [id number]          PATIENT TYPE:  OIB   LOCATION:  4707                         FACILITY:  [facility]   PHYSICIAN:  Maple Mirza, PA   DATE OF BIRTH:  03-28-1951   DATE OF ADMISSION:  04/05/2007  DATE OF DISCHARGE:  04/06/2007                               DISCHARGE SUMMARY   ALLERGIES:  1. This patient has an intolerance to flecainide which caused      diarrhea.  2. Rythmol caused increase in his ectopy.  3. Sotalol initiation caused increase in the QTc.   TOTAL TIME OF DICTATION:  Greater than 35 minutes.   FINAL DIAGNOSES:  1. Discharging day #1, status post electrophysiology study attempted      radiofrequency catheter ablation of RV inflow tract PVC's (unable      to eliminate ectopy).  2. Symptomatic palpitations consist of      lightheadedness/nausea/fatigue/sleep disturbances      a.     Flecainide failed secondary to diarrhea.      b.     Rythmol failed secondary increasing in ectopy.      c.     Sotalol failed secondary to increased QTc.      d.     Failed ablation this admission.   SECONDARY DIAGNOSES:  Cardiac, MRI ejection fraction 45%, a nondistended evaluation for  arrhythmogenic right ventricular dysplasia.   PROCEDURE:  April 05, 2007 electrophysiology study.  Application of  radiofrequency catheter ablation to the right ventricular inflow tract  unable to eliminate ectopy Dr. Sherryl Manges.   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  They were associated with  lightheadedness, nausea and weakness.  They have become progressively  debilitating.  In the past, he has been tried on beta-blockers.  He was  taken off them secondary to hypotension.  He has been seen at [facility] for these symptoms.  He had a negative stress test  echocardiogram.  He was started on flecainide, but this was  discontinued  secondary to diarrhea.  He is then tried on Rythmol.  His Rythmol has  been up titrated once from 225 mg twice daily to 325 mg twice daily.  Unfortunately, this is contributing to his ectopy and this was  discontinued.  He had been started on Sotalol at an outside hospital in  [place], [place].  However, the Sotalol caused increase in the QT  interval, and this was discontinued before the patient had left the  hospital trial site.  The patient will present now for VT ablation using  electromechanical mapping guidance with [name].   HOSPITAL COURSE:  The patient presented electively April 05, 2007.  Electrophysiology study with radiofrequency catheter ablation of the  right ventricular outflow tract failed to eliminate his symptomatic  ventricular  ectopy.  Patient discharging postprocedure day #1.  He is in sinus  rhythm with isolated PVCs, not particularly symptomatic at the present  time.  He goes home with no discharge  medications.  [name] has been  in contact with [facility], and they will call him  to arrange an appointment for repeat ablation attempt.      Maple Mirza, PA     GM/MEDQ  D:  04/06/2007  T:  04/06/2007  Job:  [id number]   cc:   [facility] [place] Dr. Joesph Fillers, MD, [facility]  Dr. Adele Barthel

## 2010-11-30 NOTE — Letter (Signed)
January 30, 2007    Dr. Adele Barthel  [facility]  [address]  [place], [place]  [address]   RE:  Derek, Cervantes  MRN:  [id number]  /  DOB:  08-15-1950   Dear Dr. Reinaldo Raddle,   It was a pleasure to see your friend and patient, Derek Cervantes, today  regarding PVCs.   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  This  became progressively debilitating 60  and he ended up being tried on some  beta blockers and then transferred to [facility] because of them.  He apparently had a negative stress echo somewhere along the line and  was then seen in consultation by Dr. Luella Cook at [facility] who  recommended flecainide therapy.  This turned out to be intolerable  because of diarrhea and he was discharged on Rythmol at 225 mg twice  daily.  He has taken 2 doses as of today and feels a good deal better.   As noted, this has been going on for just a couple of weeks.   His heart as best as we know is otherwise normal.   PAST MEDICAL HISTORY:  Notable for:  1. Dyslipidemia.  2. Erectile dysfunction.  3. Diverticulosis.  4. Chronic back pain.   PAST SURGICAL HISTORY:  Notable for:  1. Tonsillectomy.  2. Cataract removal.   REVIEW OF SYSTEMS:  Otherwise broadly negative.   SOCIAL HISTORY:  He is married, Copywriter, advertising.  He has 3  children.  He does not use cigarettes, alcohol or recreational drugs.   MEDICATIONS:  Include:  1. Rythmol 225 b.i.d.  2. Aspirin 81.   He has no known drug allergies.   EXAMINATION:  He is a well-developed, well-nourished middle-aged  Caucasian male, appearing his stated age of 60.  His blood pressure is 118/80, his pulse is 67.  HEENT:  Demonstrates no icterus or xanthoma.  The neck veins were flat.  Carotids were brisk and full bilaterally  without bruits.  BACK:  Without kyphosis or scoliosis.  LUNGS:  Clear.  HEART:  Rhythm was irregular  and pattern of trigeminy.  There was an  early systolic murmur.  ABDOMEN:  Soft with active bowel sounds, without midline pulsatile mass  or hepatomegaly.  Femoral pulses were 2+, distal pulses were intact.  There is no  clubbing, cyanosis or edema.  NEUROLOGIC:  Grossly normal.  SKIN:  Warm and dry.   Electrocardiogram dated today demonstrated sinus rhythm at 67 with  intervals of 0.14/0.11/0.43.  The axis was rightward at 90 degrees.  There were PVCs with a left bundle superior axis morphology, suggesting  a right inferior ventricular origin.  Lead V1 was  characterized by a  striking R prime with a negative deflection thereafter, certainly could  be consistent with an epsilon wave.   IMPRESSION:  1. Symptomatic ventricular ectopy emerging in the inferior right      ventricle.  2. Abnormal electrocardiogram consistent with an epsilon wave.  3. Rythmol therapy.  4. Abnormal electrocardiogram.   Dr. Reinaldo Raddle, Dr. Lorelle Formosa has significantly symptomatic ventricular ectopy  emerging out of the low right ventricle.  The differential diagnosis is  a benign ventricular ectopy versus arrhythmogenic RV dysplasia.  The  origin favors the latter.  The abnormal electrocardiogram with a  suggestion of an epsilon wave in lead V1 is concerning for  arrhythmogenic RV dysplasia.  Signal-averaged electrocardiogram and  MRI  scanning may help with the differential as ARVD is a much more malignant  process than is otherwise benign ventricular ectopy.   From a symptom point of view, he currently is doing much better on the  Rythmol, although he has only had 2 doses.  The Rythmol could certainly  be up-titrated up to a total of 425 b.i.d. as necessary.  We assess poor  arrhythmic risks by treadmill testing 72 to 96 hours after the  initiation of the drug as the offset kinetic of the drug are most  prominently seen as high heart rates.   It is also been described that patients with right ventricular  outflow  track and ventricular ectopy that there could be spontaneous remission.  This may occur in as many a 20% to 30% of patients, so at some point in  the next couple of months, consideration of back titration of the  medication, in the event that he is asymptomatic and has by Holter  monitoring no further ventricular ectopy, is reasonable.   The other issue that we will need to follow is that frequent ventricular  ectopy, and his is about 20% of his total beat burden at this point, can  be associated with cardiomyopathy.  In the event that he were symptom  free and he had developed progressive cardiomyopathic changes in his  ventricle, tentative therapy with ablation would be a preferred  alternative.   RECOMMENDATIONS:  Based on the above, for right now we will plan to get:  1. Signal-averaged EKG.  2. MRI scanning.  3. GXT to assess as noted above.  We will plan in about 6 to 8 weeks      to undertake Holter monitoring and a repeat echo to assess  both      the burden of his ventricular ectopy as well with any potential      consequence to his left ventricle.   Thank you very much for allowing me to participate in his care.    Sincerely,      Derek Salvia, MD, [facility]  Electronically Signed    SCK/MedQ  DD: 01/30/2007  DT: 01/31/2007  Job #: [id number]   CC:   Jillyn Hidden, M.D. Lucienne Minks, M.D. Sharol Harness

## 2010-11-30 NOTE — Letter (Signed)
March 28, 2008    Tomasa Blase, MD  Cardiovascular Medicine [facility]  [address]  [place], [place] [address]   RE:  Derek Cervantes, Derek Cervantes  MRN:  [id number]  /  DOB:  [birth year]   Dear Dr. 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   It was a pleasure to see Derek Cervantes today following your ablation 2  months ago.  Interestingly in our procedure found PVCs that were  originating on the septal side of inflow portion of the right ventricle  and you found him at 9 o'clock from a tricuspid annulus suggesting that  he might have his own ringer fire.  As you know, he has had recurrence  of PVCs following the procedure being a day or 2 afterwards.  However,  about 2 weeks or 3 weeks after that they became quiescent and have  remained so.  He is currently largely asymptomatic.   He has also had these nocturnal flutters.  Apparently an event recorder  through you guys recorded no arrhythmia with the aforementioned  symptoms.   His current medications include the aspirin at 81 and vitamins.   On examination, his blood pressure is 122/74, his pulse is 78.  His  lungs are clear.  Heart sounds are regular.  Extremities are without  edema.   His echocardiogram today in its preliminary form demonstrated normal  left ventricular functions, both left ventricle and the right ventricle,  no significant valvular abnormalities were identified.   IMPRESSION:  1. Premature ventricular contractions both inflow portion and      tricuspid valvular at 9 o'clock status post ablation.  2. Premature atrial contractions identified at the EP study in      [place].  3. Flutters unassociated with arrhythmia.  4. Nocturnal snoring with O2 sat monitoring demonstrating drops.   Dr. Lorelle Formosa, Dr. Stark Jock is doing really well.  At this point, he would  anticipate deferring the  procedure scheduled in November and in the  absence of symptoms, I would certainly concur.   We left it open to have him follow up with recurrent symptoms.   If there is anything I can do further to assist, please do not hesitate  to contact me and if you would be so kind give Shawnie Pons and Alwyn Ren my regards.    Sincerely,      Duke Salvia, MD, [facility]  Electronically Signed    SCK/MedQ  DD: 03/28/2008  DT: 03/29/2008  Job #: [id number]

## 2010-12-03 NOTE — H&P (Signed)
Derek Cervantes, Derek Cervantes NO.:  [id number]   MEDICAL RECORD NO.:  [id number]          PATIENT TYPE:  INP   LOCATION:  5703                         FACILITY:  [facility]   PHYSICIAN:  Maple Mirza, PA   DATE OF BIRTH:  Mar 21, 1951   DATE OF ADMISSION:  03/20/2007  DATE OF DISCHARGE:                              HISTORY & PHYSICAL   ELECTROPHYSIOLOGIST:  Dr. Sherryl Manges.   PRIMARY CAREGIVER:  Dr. Adele Barthel.   CARDIOLOGIST:  Dr. Luella Cook at [facility].   ALLERGIES:  He has intolerance to FLECAINIDE .   PRESENTING CIRCUMSTANCE:  My heart just bumps around, causes me to get  lightheaded, weak and nauseated.   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  At  this presentation he has a current 2-3 week history of palpitations.  They are associated with lightheadedness, nausea and weakness.  They  have become progressively debilitating.  In the past he has been tried  on beta blockers and was taken off them secondary to hypotension.  He  has been transferred in the past to [facility], when his  symptoms persisted.  He had a negative stress test echocardiogram.  He  was seen by Dr. Luella Cook at [facility].  He was started on flecainide,  but this was discontinued secondary to diarrhea.  He has been on  flecainide since 225 mg twice daily and feels better.   The electrocardiogram shows that his PVCs are quite abundant; they occur  with a left bundle superior axis morphology that suggests inferior  ventricular origin.   Dr. 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   WORKUP:  Would include:  Signal average electrocardiogram and cardiac  MRI.  This would help differentiate ARVD from a midline ventricular  ectopy (ARVD is a much more malignant process).  From a  symptomatic  point of view, the patient is doing well on Rhythmol after only two  doses.  Rhythmol could be up-titrated to a total of 425 mg twice daily  as necessary.  We will bring the patient into the hospital to assess  arrhythmias by treadmill testing, 72 hours after his initiation of the  drug.  The kinetics of the drug are most prominently seen at  high heart  rates.   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  He could have spontaneous  remission.  This could happen in as many as 20-30% of patients, so at  some point in the next couple months we need to consider back titration  of medication; and in the event he remains asymptomatic, he could have a  Holter monitor check.  If the Holter monitor proves no further  ventricular ectopy, then medications could be discontinued.   At present his ventricular ectopy is about 20% of his total beat burden.  This could be associated with a cardiomyopathy.  If he  develops  progressive cardiomyopathic changes, tentative therapy with ablation  would be a preferred alternative.  The patient will be admitted for  testing and medical management.   MEDICATIONS:  1. Rythmol 225 mg twice daily.  2. Aspirin 325 mg daily.  3. Protonix 40 mg daily.   PHYSICAL EXAMINATION:  Temperature 97.5, blood pressure 124/79, heart  rate 68, respirations 20, oxygen saturation 97% on room air.  The  patient weighs 88 kg.  He is alert and oriented x3 with no acute  distress.  HEENT:  Eyes: Pupils equal, round and reactive to light.  Extraocular  movements are intact.  Nares are patent.  Oropharynx shows no evidence  of lesion or erythema.  NECK:  Supple without carotid bruits auscultated.  LUNGS:  Clear to auscultation and percussion bilaterally.  HEART: Irregular rate and rhythm.  No murmurs, rubs or gallops.  ABDOMEN:  Soft, nondistended.  Bowel sounds are present.  No  hepatosplenomegaly.   No abdominal aorta pulsation or dilatation.  EXTREMITIES:  Show no evidence of clubbing, cyanosis or edema.   ASSESSMENT:  1. Palpitations; which are symptomatic causing lightheadedness, nausea      and weakness.  2. 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   PLAN:  The patient will be admitted.  He will have an up-titration of  his Rythmol to 325 mg twice daily.  He will have an EKG and a cardiac  MRI.  He will be followed by electrophysiology service during this  hospitalization.      Maple Mirza, PA     GM/MEDQ  D:  03/20/2007  T:  03/20/2007  Job:  [id number]   cc:   Duke Salvia, MD, [facility]  Dr. Adele Barthel  Dr. Luella Cook, [facility]

## 2010-12-13 IMAGING — CR DG CHEST 2V
2 series · 2 of 2 positions shown · non-contrast
Comparison: None

CLINICAL DATA: Shortness of breath.

CHEST - 2 VIEW

[view not recorded (1 of 2)]
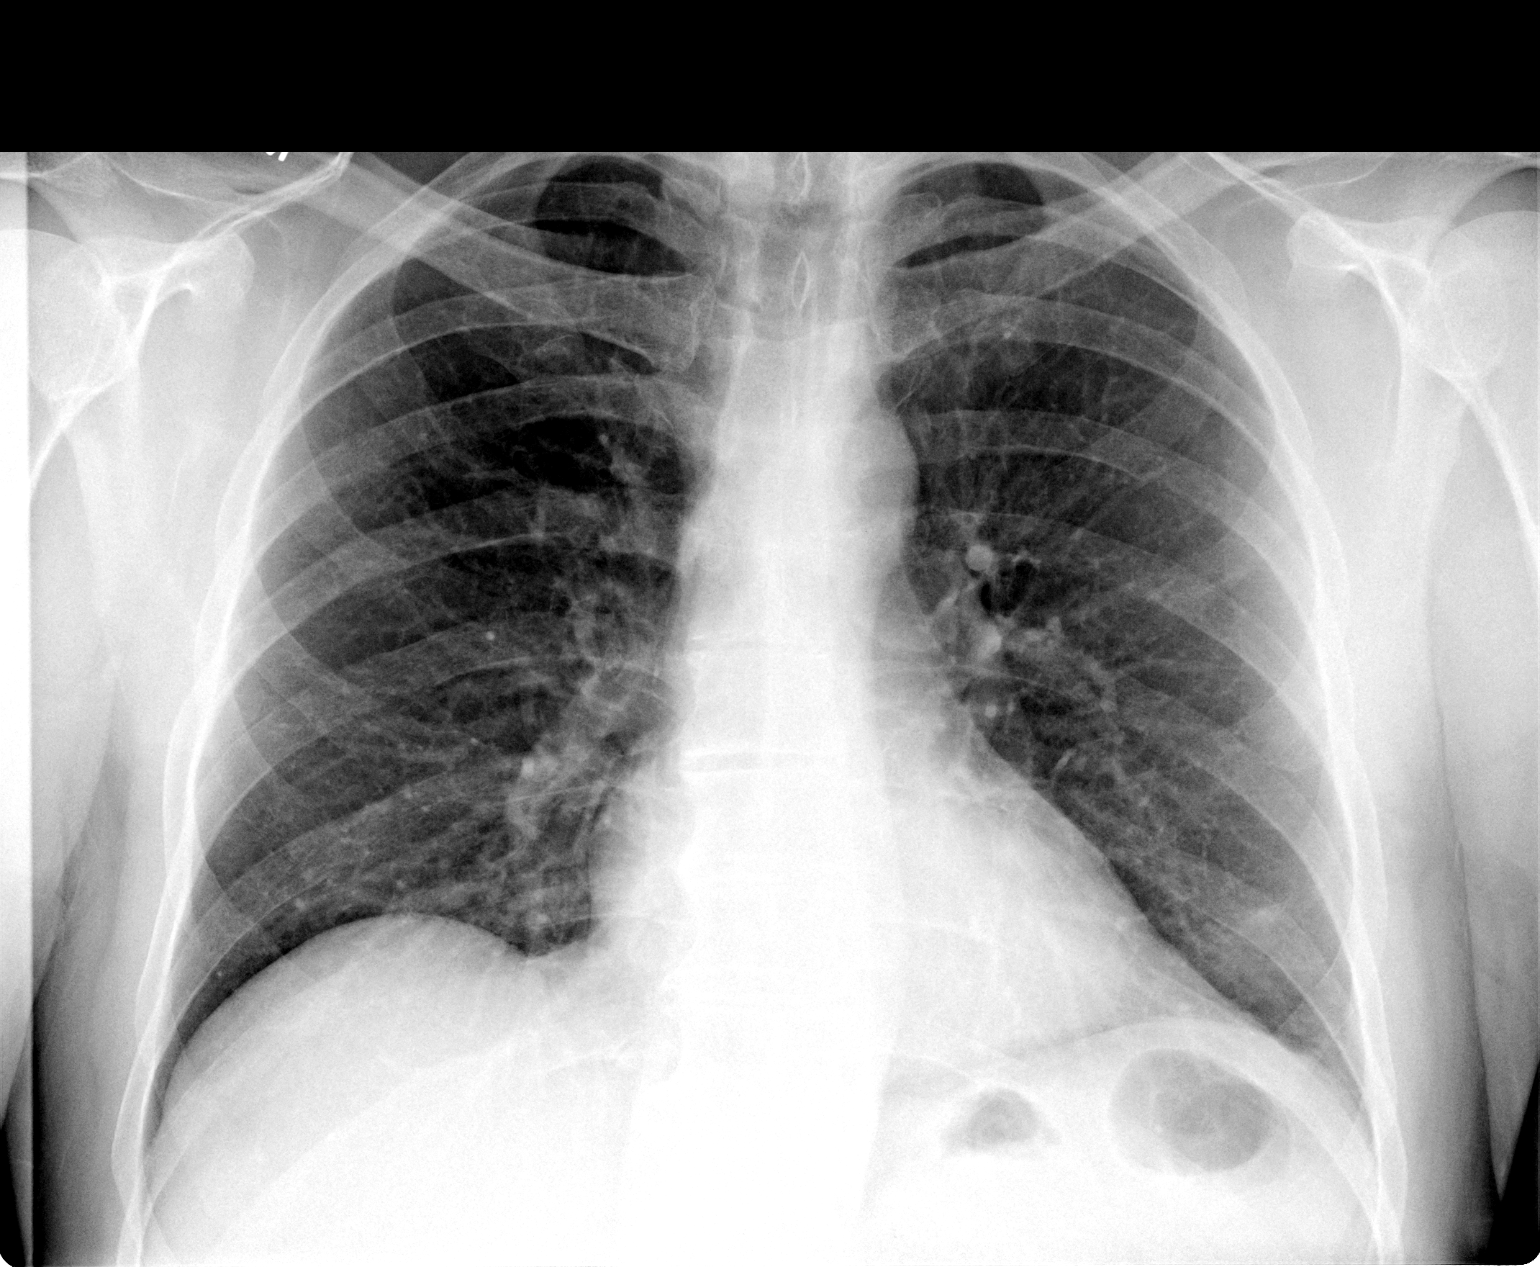

[view not recorded (2 of 2)]
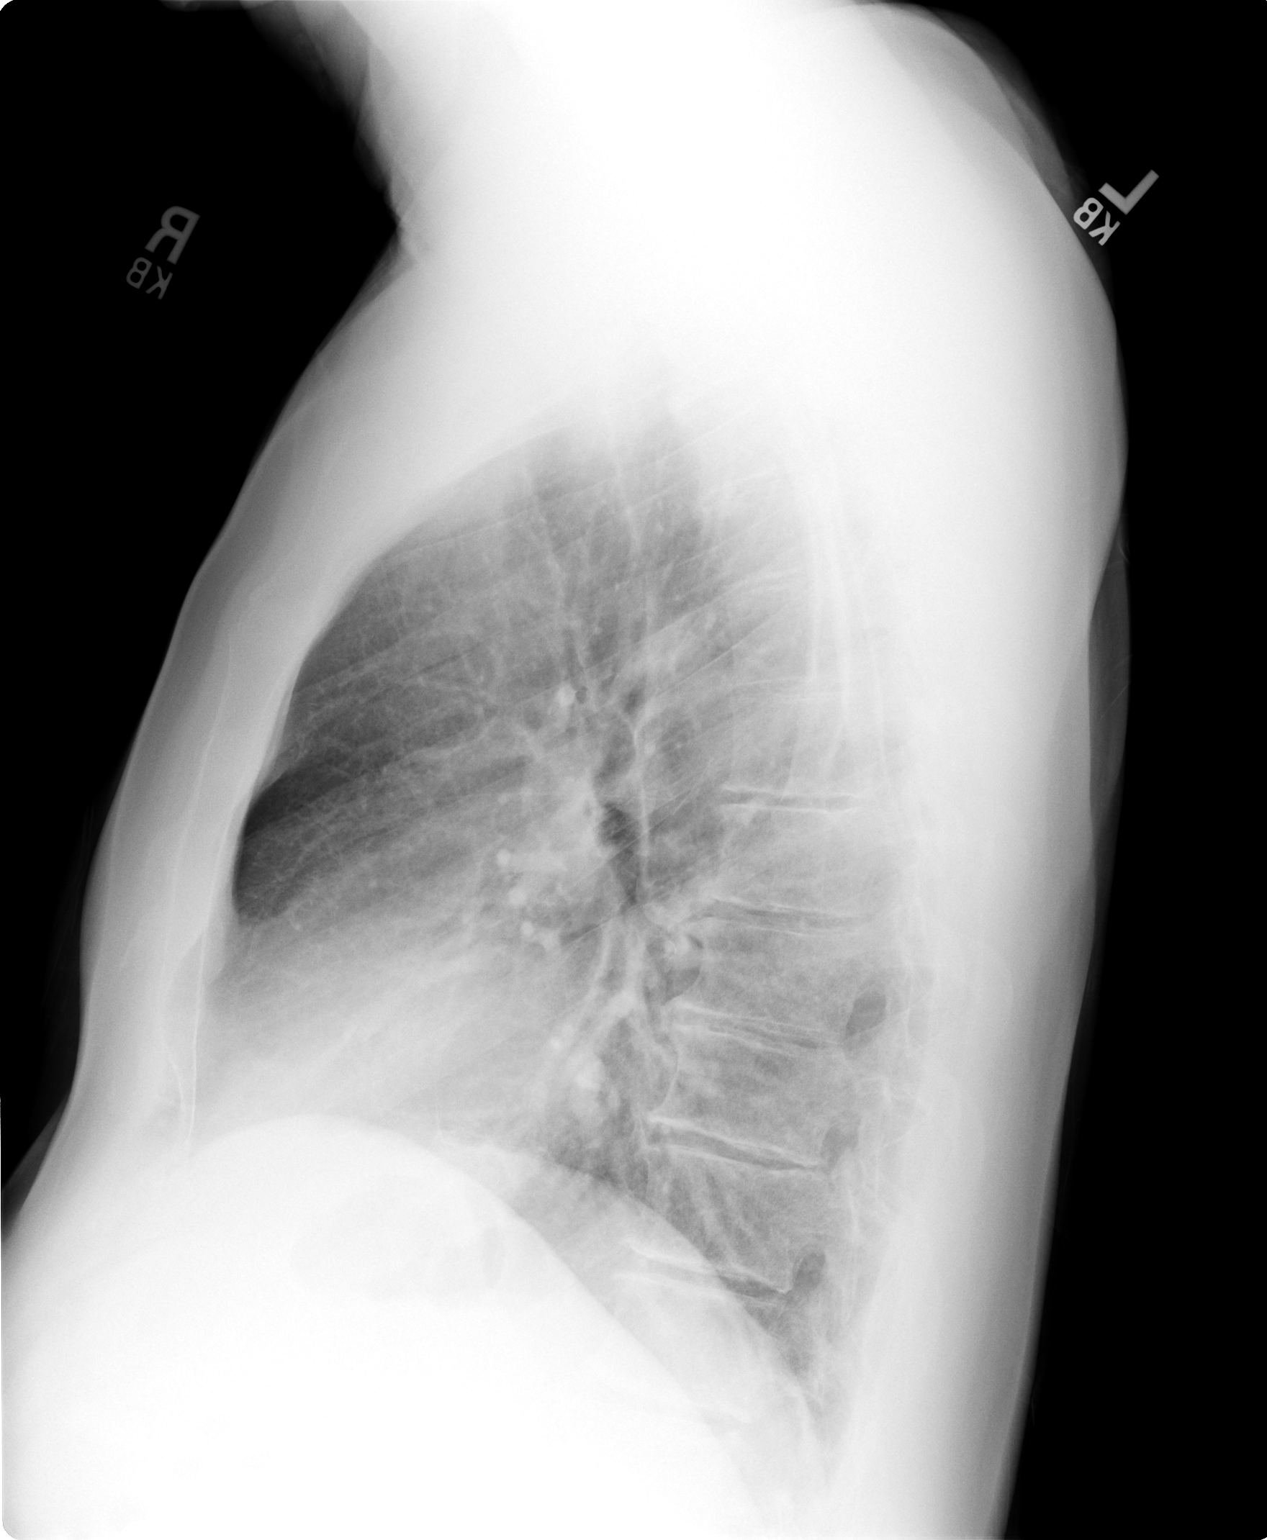

[2 of 2 positions shown; findings below may reference images not displayed]

FINDINGS: There is diffuse interstitial prominence.  Vague nodule
in the projects in the region of the lingula.  Heart is normal
size.  No effusions.  Degenerative changes in the thoracic spine.
IMPRESSION: Diffuse interstitial prominence, possibly mild chronic interstitial
lung disease.

Vague nodular density projecting over the lingula.  If outside old
films are available, these would be helpful for direct comparison.
Otherwise, further evaluation with chest CT or followup chest x-ray
in 3 months is recommended.

## 2011-01-03 IMAGING — RF DG CHEST FLUORO
1 series · 1 of 1 positions shown · non-contrast
Comparison: None

CLINICAL DATA: Shortness of breath, history of polio as child

CHEST FLUOROSCOPY
TECHNIQUE: Real-time fluoroscopic evaluation of the chest was
performed.

[Series 1: run · 1 of 1 slices shown]
[im 1/1]
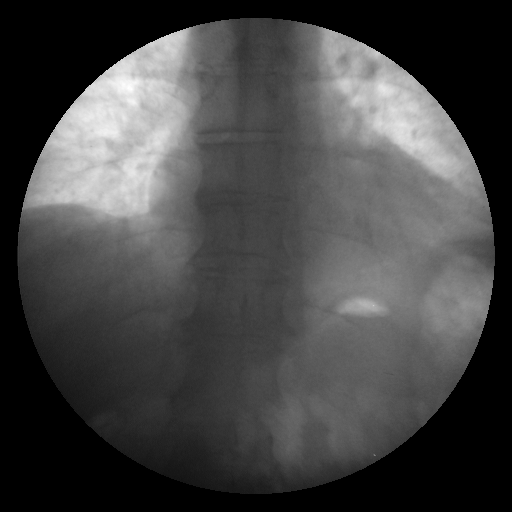

[1 of 1 positions shown; findings below may reference images not displayed]

FINDINGS: Under fluoroscopy the hemidiaphragms were evaluated
during deep inspiration and expiration as well as using the "sniff
test."  The hemidiaphragms move appropriately.
IMPRESSION: No evidence of paralysis of the hemidiaphragm.

## 2011-02-07 ENCOUNTER — Encounter: Payer: Self-pay | Admitting: Internal Medicine

## 2011-03-10 ENCOUNTER — Encounter: Payer: Self-pay | Admitting: Internal Medicine

## 2011-03-10 ENCOUNTER — Ambulatory Visit (INDEPENDENT_AMBULATORY_CARE_PROVIDER_SITE_OTHER): Payer: BC Managed Care – PPO | Admitting: Internal Medicine

## 2011-03-10 VITALS — BP 118/68 | HR 66 | Ht 72.0 in | Wt 203.0 lb

## 2011-03-10 DIAGNOSIS — R079 Chest pain, unspecified: Secondary | ICD-10-CM

## 2011-03-10 DIAGNOSIS — I452 Bifascicular block: Secondary | ICD-10-CM

## 2011-03-10 DIAGNOSIS — I491 Atrial premature depolarization: Secondary | ICD-10-CM

## 2011-03-10 DIAGNOSIS — R072 Precordial pain: Secondary | ICD-10-CM | POA: Insufficient documentation

## 2011-03-10 DIAGNOSIS — I4949 Other premature depolarization: Secondary | ICD-10-CM

## 2011-03-10 NOTE — Assessment & Plan Note (Signed)
Stable

## 2011-03-10 NOTE — Progress Notes (Signed)
  HPI  Derek Cervantes is a 60 y.o. male seen in followup for PVCs for which he underwent ablation at [facility] with Doylene Canning  He has had recurrence of ventricular ectopy. At this point it occurs in clusters typically 3-4 hours once a month or so. He also has occasional brief episodes when he lays down to go to sleep at night. There is no significant impairment of exercise tolerance. There is still significant psychological disruption  He continues to have some degree of chest discomfort. He date as this back many years. It is unchanged. It typically lasts about an hour. It is not associated with exertion.  Past Medical History  Diagnosis Date  . Ventricular tachycardia     s/p ablation at [facility]  . Cardiomyopathy     PVC related  . Prostate cancer     s/p robotic surgery - stage 3  . Dyspnea     recumbent  . Limb tremor     hand tremors unassociated with palpitation  . Polio     as a child  . Diverticulosis   . PPD positive 1970  . ED (erectile dysfunction)     Past Surgical History  Procedure Date  . Radical prostate robotic    . Cataract extraction     Current Outpatient Prescriptions  Medication Sig Dispense Refill  . ascorbic acid (VITAMIN C) 500 MG tablet Take 500 mg by mouth daily.          Allergies  Allergen Reactions  . Flecainide Acetate     REACTION: intolerance  . Propafenone Hcl     REACTION: intolerance  . Sotalol Hcl     REACTION: intolerance    Review of Systems negative except from HPI and PMH  Physical Exam Well developed and well nourished in no acute distress HENT normal E scleral and icterus clear Neck Supple JVP flat; carotids brisk and full Clear to ausculation Regular rate and rhythm, no murmurs gallops or rub Soft with active bowel sounds No clubbing cyanosis and edema Alert and oriented, grossly normal motor and sensory function Skin Warm and Dry  ECG sinus rhythm  Intervals 0.13/0.14/0.44 Right bundle-branch  left posterior fascicular block  Assessment and  Plan

## 2011-03-10 NOTE — Assessment & Plan Note (Signed)
stable °

## 2011-03-10 NOTE — Assessment & Plan Note (Signed)
Long-standing episodic chest discomfort with some concerns for ischemia however, his long duration and stable nature makes further pursued at this point not particularly inviting

## 2011-03-10 NOTE — Patient Instructions (Signed)
Your physician wants you to follow-up in: 6 months  You will receive a reminder letter in the mail two months in advance. If you don't receive a letter, please call our office to schedule the follow-up appointment.  Your physician recommends that you continue on your current medications as directed. Please refer to the Current Medication list given to you today.  

## 2011-03-16 ENCOUNTER — Telehealth: Payer: Self-pay | Admitting: Internal Medicine

## 2011-03-16 NOTE — Telephone Encounter (Signed)
I spoke with the patient. He states that he had an episode of trigeminy and bigeminy yesterday that lasted about all day. He did go to his local hospital and had an EKG done. He is going to have them fax this to [place]. I will be looking out for this.

## 2011-03-16 NOTE — Telephone Encounter (Signed)
Pt calling re sending [place] an ekg report

## 2011-03-16 NOTE — Telephone Encounter (Signed)
Pt calling back to speak with nurse

## 2011-03-18 NOTE — Telephone Encounter (Signed)
I received the patient's EKG. I spoke with him this morning and made him aware that I will review with [name] this afternoon. He is agreeable. I can reach him at [phone].

## 2011-03-22 NOTE — Telephone Encounter (Signed)
I reviewed this with [name] today. He states he did call the patient in regards to his EKG.

## 2011-04-28 LAB — BASIC METABOLIC PANEL
BUN: 11
Creatinine, Ser: 0.95
GFR calc Af Amer: >60
GFR calc non Af Amer: >60
Potassium: 4

## 2011-04-28 LAB — PROTIME-INR
INR: 0.9
Prothrombin Time: 12.7

## 2011-04-28 LAB — APTT: aPTT: 27

## 2011-04-28 LAB — CBC
Platelets: 180
RDW: 13.8

## 2011-05-02 LAB — COMPREHENSIVE METABOLIC PANEL
ALT: 25
Alkaline Phosphatase: 68
BUN: 14
Chloride: 107
Glucose, Bld: 130 — ABNORMAL HIGH
Potassium: 3.9
Total Bilirubin: 1

## 2011-05-02 LAB — CARDIAC PANEL(CRET KIN+CKTOT+MB+TROPI)
CK, MB: 1.8
Relative Index: INVALID
Relative Index: INVALID
Troponin I: 0.01

## 2011-05-02 LAB — LIPID PANEL
Cholesterol: 194
HDL: 39 — ABNORMAL LOW
Total CHOL/HDL Ratio: 5
Triglycerides: 84

## 2011-05-02 LAB — PROTIME-INR: Prothrombin Time: 13

## 2011-05-02 LAB — MAGNESIUM: Magnesium: 2.1

## 2011-10-13 ENCOUNTER — Ambulatory Visit: Payer: BC Managed Care – PPO | Admitting: Internal Medicine

## 2011-10-20 ENCOUNTER — Ambulatory Visit: Payer: BC Managed Care – PPO | Admitting: Internal Medicine

## 2011-12-29 ENCOUNTER — Ambulatory Visit: Payer: BC Managed Care – PPO | Admitting: Internal Medicine

## 2011-12-29 ENCOUNTER — Ambulatory Visit (INDEPENDENT_AMBULATORY_CARE_PROVIDER_SITE_OTHER): Payer: BC Managed Care – PPO | Admitting: Internal Medicine

## 2011-12-29 ENCOUNTER — Encounter: Payer: Self-pay | Admitting: Internal Medicine

## 2011-12-29 VITALS — BP 116/60 | HR 64 | Ht 72.0 in | Wt 200.0 lb

## 2011-12-29 DIAGNOSIS — I493 Ventricular premature depolarization: Secondary | ICD-10-CM

## 2011-12-29 DIAGNOSIS — I4949 Other premature depolarization: Secondary | ICD-10-CM

## 2011-12-29 DIAGNOSIS — R072 Precordial pain: Secondary | ICD-10-CM

## 2011-12-29 DIAGNOSIS — I428 Other cardiomyopathies: Secondary | ICD-10-CM

## 2011-12-29 NOTE — Assessment & Plan Note (Signed)
Ejection fraction remains relatively well-preserved; EF 2009 was normal

## 2011-12-29 NOTE — Progress Notes (Signed)
  HPI  Peighton Edgin is a 61 y.o. male seen in followup for PVCs for which he underwent ablation at [facility] with Doylene Canning  He has had recurrence of ventricular ectopy. At this point it occurs in clusters typically 3-4 hours once a month or so. He also has occasional brief episodes when he lays down to go to sleep at night. There is no significant impairment of exercise tolerance. There is still significant psychological disruption  He continues to have some degree of chest discomfort.   These discomforts are somewhat different from before. He describes it as a right-sided pressure. It occurs typically in the venous frequently after eating. It lasts minutes. 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 His palpitations are largely quiet.    Past Medical History  Diagnosis Date  . Ventricular tachycardia     s/p ablation at [facility]  . Cardiomyopathy     PVC related  . Prostate cancer     s/p robotic surgery - stage 3  . Dyspnea     recumbent  . Limb tremor     hand tremors unassociated with palpitation  . Polio     as a child  . Diverticulosis   . PPD positive 1970  . ED (erectile dysfunction)     Past Surgical History  Procedure Date  . Radical prostate robotic    . Cataract extraction     Current Outpatient Prescriptions  Medication Sig Dispense Refill  . ascorbic acid (VITAMIN C) 500 MG tablet Take 500 mg by mouth daily.          Allergies  Allergen Reactions  . Flecainide Acetate     REACTION: intolerance  . Propafenone Hcl     REACTION: intolerance  . Sotalol Hcl     REACTION: intolerance    Review of Systems negative except from HPI and PMH  Physical Exam BP 116/60  Pulse 64  Ht 6' (1.829 m)  Wt 200 lb (90.719 kg)  BMI 27.12 kg/m2  SpO2 98% Well developed and well nourished in no acute distress HENT normal E scleral and icterus clear Neck Supple JVP flat;  carotids brisk and full Clear to ausculation egular rate and rhythm, no murmurs gallops or rub Soft with active bowel sounds No clubbing cyanosis no Edema Alert and oriented, grossly normal motor and sensory function Skin Warm and Dry  ECG today demonstrates sinus rhythm at 61 Intervals 13/14/44 Right bundle branch block left posterior fascicular block  Assessment and  Plan

## 2011-12-29 NOTE — Assessment & Plan Note (Signed)
He continues to have atypical chest pains. Not withstanding the fact that they're somewhat different, I do not think a cardiac given the high intensity effort that he is able to accomplish most every weekend. We will continue to observe

## 2011-12-29 NOTE — Assessment & Plan Note (Signed)
Symptomatically largely resolved

## 2012-01-03 NOTE — Addendum Note (Signed)
Addended by: Reine Just on: 01/03/2012 04:52 PM   Modules accepted: Orders

## 2012-08-06 ENCOUNTER — Telehealth: Payer: Self-pay | Admitting: Internal Medicine

## 2012-08-06 NOTE — Telephone Encounter (Signed)
New Problem:    Patient called in wanting to know what the name of the pulmonologist was that [name] referred them to.  Please call back.

## 2012-08-06 NOTE — Telephone Encounter (Signed)
He was in a room with a patient.  I let them know Dr Graciela Husbands here tomorrow and we can ask him as I do not see anything in the notes regarding a pulmonologist.

## 2012-08-07 NOTE — Telephone Encounter (Signed)
F/u   Status of referral to pulmonologist

## 2012-08-08 NOTE — Telephone Encounter (Signed)
I left a message for the patient to call. 

## 2012-08-08 NOTE — Telephone Encounter (Signed)
I spoke with Dr. Lorelle Formosa. He was calling in regards to his wife- Derek Cervantes ([birth year]). He was stating that [name] saw [name] in pulmonary. They were not completely satisfied with him. They were wanting to know who else it was that [name] recommended in pulmonary. The patient has had appointments with [name] and [name] that were cancelled by either the patient or office. I explained that [name] probably was suggesting [name], but I will clarify and call them back. I also explained I am not sure of the process in pulmonary of switching doctors, so I would call them and find out. Dr. Lorelle Formosa is agreeable with this.

## 2012-08-08 NOTE — Telephone Encounter (Signed)
F/U   Status of referral to pulmonologist.

## 2012-08-12 NOTE — Telephone Encounter (Signed)
Perfect

## 2012-08-15 ENCOUNTER — Telehealth: Payer: Self-pay | Admitting: Internal Medicine

## 2012-08-15 NOTE — Telephone Encounter (Signed)
PT WOULD LIKE TO GET A REFERRAL TO A PULMONOLOGIST WITH [facility], WHICH ONE DR Elenor Legato RECOMMED [phone]

## 2012-08-16 NOTE — Telephone Encounter (Signed)
Follow-up:    Patient returned your call.  Please call back. 

## 2012-08-17 NOTE — Telephone Encounter (Signed)
Follow-up:  Patient returned your call. Please call back. Derek Cervantes 08/15/2012 11:37 AM Signed  PT WOULD LIKE TO GET A REFERRAL TO A PULMONOLOGIST WITH [facility], WHICH ONE DR Elenor Legato RECOMMED [phone]   I spoke with the patient and he is aware that [name] is recommended. Sherri Rad, RN, BSN

## 2012-08-17 NOTE — Telephone Encounter (Signed)
Previous encounter opened for the same thing. Will close this encounter.  

## 2012-08-21 ENCOUNTER — Telehealth: Payer: Self-pay | Admitting: Internal Medicine

## 2012-08-21 NOTE — Telephone Encounter (Signed)
Follow-up:    Patient returned your call.  Please call back. 

## 2012-08-21 NOTE — Telephone Encounter (Signed)
I left a message for the patient to call. 

## 2012-08-21 NOTE — Telephone Encounter (Signed)
New problem    Returning call back to nurse.   

## 2012-08-24 NOTE — Telephone Encounter (Signed)
I spoke with Dr. Lorelle Formosa. He states they have called the pulmonary group and were told that the recommendation was for the patient to see [name] instead of [name]. In a previous call, Dr. Lorelle Formosa had stated they were not completely satisfied with [name]. [name] had recommended [name]. Per Dr. Lorelle Formosa, the pulmonary office stated there were other pulmonologist that his wife could see. I explained I am not sure why they are not allowing [name]. I will review with [name] to see if he recommends another pulmonologist or if he feels I need to call pulmonary to specifically get an appointment for the patient with [name]. Dr. Lorelle Formosa is aware I will follow up with him next week. I will call his home # first and leave a message if no answer.

## 2012-09-07 NOTE — Telephone Encounter (Signed)
I will see patient. I have created an office phone note under patient Derek Cervantes chart and I have discussed this with Dr. Clide Cliff. I will close the message

## 2012-09-07 NOTE — Telephone Encounter (Signed)
I left a message for Dr. And Mrs. Prather that Dr. Marchelle Gearing will see her and to be looking for a call from his office to schedule this.

## 2012-09-07 NOTE — Telephone Encounter (Signed)
murali  Would you be willing see this mans wife,Derek Cervantes.,  She previously saw Dr young  thanks

## 2012-09-28 ENCOUNTER — Encounter: Payer: Self-pay | Admitting: Internal Medicine

## 2012-09-28 LAB — POCT H PYLORI SCREEN: H Pylori Screen, POC: NEGATIVE

## 2012-10-02 ENCOUNTER — Ambulatory Visit: Payer: BC Managed Care – PPO | Admitting: Internal Medicine

## 2012-10-19 ENCOUNTER — Ambulatory Visit (INDEPENDENT_AMBULATORY_CARE_PROVIDER_SITE_OTHER): Payer: BC Managed Care – PPO | Admitting: *Deleted

## 2012-10-19 VITALS — BP 120/70 | HR 70 | Wt 200.8 lb

## 2012-10-19 DIAGNOSIS — R072 Precordial pain: Secondary | ICD-10-CM

## 2012-10-19 NOTE — Progress Notes (Signed)
Pt walked into the office today with his wife who is having a CT scan and c/o chest pain. Pt reports on and off chest pain for one to one and 1/2 weeks. It is a steady pressure in the center of his chest. He saw his PCP about two week ago for the same issues. His EKG had been sent to [place] and was unchanged. He reported he was not having the discomfort at that time. His PCP thought it maybe GI and started him on prilosec 20 mg once daily. He says the discomfort did initially improve but has returned. Nothing makes it worse, lying flat makes it better and it seems to improve with eating. He denies any other symptoms, no SOB, dizziness, sweats or palpitations. Discussed with dr Eden Emms, pt instructed to increase prilosec to twice daily. He will see [facility] on 11-08-12 when he is back in town. He will call or follow up with his PCP prior to appt if symptoms change. EKG reviewed by dr Eden Emms and shows no acute  Changes. Pt agreed with this plan.

## 2012-11-08 ENCOUNTER — Ambulatory Visit: Payer: BC Managed Care – PPO | Admitting: Physician Assistant

## 2012-11-30 ENCOUNTER — Ambulatory Visit (INDEPENDENT_AMBULATORY_CARE_PROVIDER_SITE_OTHER): Payer: BC Managed Care – PPO | Admitting: Internal Medicine

## 2012-11-30 VITALS — BP 120/66 | HR 71 | Ht 72.0 in | Wt 203.0 lb

## 2012-11-30 DIAGNOSIS — I4949 Other premature depolarization: Secondary | ICD-10-CM

## 2012-11-30 DIAGNOSIS — R202 Paresthesia of skin: Secondary | ICD-10-CM

## 2012-11-30 DIAGNOSIS — I493 Ventricular premature depolarization: Secondary | ICD-10-CM

## 2012-11-30 DIAGNOSIS — R29898 Other symptoms and signs involving the musculoskeletal system: Secondary | ICD-10-CM

## 2012-11-30 DIAGNOSIS — R209 Unspecified disturbances of skin sensation: Secondary | ICD-10-CM

## 2012-11-30 NOTE — Assessment & Plan Note (Signed)
quiescent

## 2012-11-30 NOTE — Patient Instructions (Addendum)
Your physician recommends that you continue on your current medications as directed. Please refer to the Current Medication list given to you today.  Your physician wants you to follow-up in: 1 year with Dr. Klein.  You will receive a reminder letter in the mail two months in advance. If you don't receive a letter, please call our office to schedule the follow-up appointment.  

## 2012-11-30 NOTE — Progress Notes (Signed)
Patient Care Team: Jerene Pitch as PCP - General (Internal Medicine)   HPI  Derek Cervantes is a 62 y.o. male seen in followup for PVCs for which he underwent ablation at [facility] with Doylene Canning  And it had no significant recurrence of palpitations. He continues with some atypical chest discomfort. He is not physically fit. 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 He saw a neurologist and run of a year or so ago who wondered whether with an L5 compression issue.       Past Medical History  Diagnosis Date  . Ventricular tachycardia     s/p ablation at the [facility]  . Cardiomyopathy     PVC related  . Prostate cancer     s/p robotic surgery - stage 3  . Dyspnea     recumbent  . Limb tremor     hand tremors unassociated with palpitation  . Polio     as a child  . Diverticulosis   . PPD positive 1970  . ED (erectile dysfunction)     Past Surgical History  Procedure Laterality Date  . Radical prostate robotic     . Cataract extraction      Current Outpatient Prescriptions  Medication Sig Dispense Refill  . ascorbic acid (VITAMIN C) 500 MG tablet Take 500 mg by mouth daily.        . lansoprazole (PREVACID) 15 MG capsule Take 15 mg by mouth daily.      Marland Kitchen NITROSTAT 0.4 MG SL tablet Take as needed       No current facility-administered medications for this visit.    Allergies  Allergen Reactions  . Flecainide Acetate     REACTION: intolerance  . Propafenone Hcl     REACTION: intolerance  . Sotalol Hcl     REACTION: intolerance    Review of Systems negative except from HPI and PMH  Physical Exam BP 120/66  Pulse 71  Ht 6' (1.829 m)  Wt 203 lb (92.08 kg)  BMI 27.53 kg/m2 Well developed and nourished in no acute distress HENT normal Neck supple with JVP-flat Clear Regular rate and rhythm, no murmurs or gallops Abd-soft with active BS No Clubbing cyanosis edema Skin-warm and dry A &  Oriented  Grossly normal sensory  He has obvious weakness in his hands.    Assessment and  Plan

## 2012-11-30 NOTE — Addendum Note (Signed)
Addended by: Lisabeth Devoid F on: 11/30/2012 04:12 PM   Modules accepted: Orders

## 2012-11-30 NOTE — Assessment & Plan Note (Signed)
The patient has worsening tingling in his hands and his feet with objective muscle his hands. I strongly encourage him to seek neurological consultation.

## 2012-12-07 ENCOUNTER — Encounter: Payer: Self-pay | Admitting: Neurology

## 2012-12-07 ENCOUNTER — Ambulatory Visit (INDEPENDENT_AMBULATORY_CARE_PROVIDER_SITE_OTHER): Payer: BC Managed Care – PPO | Admitting: Neurology

## 2012-12-07 VITALS — BP 92/66 | HR 72 | Temp 98.0°F | Resp 16 | Wt 203.0 lb

## 2012-12-07 DIAGNOSIS — R200 Anesthesia of skin: Secondary | ICD-10-CM

## 2012-12-07 DIAGNOSIS — R29898 Other symptoms and signs involving the musculoskeletal system: Secondary | ICD-10-CM

## 2012-12-07 DIAGNOSIS — R209 Unspecified disturbances of skin sensation: Secondary | ICD-10-CM

## 2012-12-07 LAB — FOLATE: Folate: 23.3 ng/mL (ref >5.9)

## 2012-12-07 LAB — CK: Total CK: 164 U/L (ref 7–232)

## 2012-12-07 LAB — VITAMIN B12: Vitamin B-12: >1500 pg/mL — ABNORMAL HIGH (ref 211–911)

## 2012-12-07 NOTE — Patient Instructions (Addendum)
Your appointment for the nerve conduction studies/electromyelogram is scheduled for Monday, June 9th at 9:00am at [facility] [address] [place], [place] [phone].  Call us a couple of weeks after your test for the results.

## 2012-12-07 NOTE — Progress Notes (Signed)
Derek Cervantes was seen today in neurologic consultation at the request of [name].  This patient is accompanied in the office by his spouse who supplements the history.  His PCP is PRYOR,ROBERT E, MD.  The consultation is for the evaluation of paresthesias.  It started 2 years ago in the L foot (in the L5 dermatome).  It has started on the R on the lateral aspect of the foot.  It is burning.  He has some weakness in the hands, L greater than R.  He has no trouble opening jars but has trouble holding things with the thumb and pointer finger.  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  He is not DM.  He is not unstable.  He really has no paresthesias in the hands.  He does have some neck and back pain.   Neuroimaging has not previously been performed.    PREVIOUS MEDICATIONS: Not applicable  ALLERGIES:   Allergies  Allergen Reactions  . Flecainide Acetate     REACTION: intolerance  . Propafenone Hcl     REACTION: intolerance  . Sotalol Hcl     REACTION: intolerance    CURRENT MEDICATIONS:  Current Outpatient Prescriptions on File Prior to Visit  Medication Sig Dispense Refill  . ascorbic acid (VITAMIN C) 500 MG tablet Take 500 mg by mouth daily.        . lansoprazole (PREVACID) 15 MG capsule Take 15 mg by mouth daily.      Marland Kitchen NITROSTAT 0.4 MG SL tablet Take as needed       No current facility-administered medications on file prior to visit.    PAST MEDICAL HISTORY:   Past Medical History  Diagnosis Date  . Ventricular tachycardia     s/p ablation at the [facility]  . Cardiomyopathy     PVC related  . Prostate cancer     s/p robotic surgery - stage 3  . Dyspnea     recumbent  . Limb tremor     hand tremors unassociated with palpitation  . Polio     as a child  . Diverticulosis   . PPD positive 1970  . ED (erectile dysfunction)     PAST SURGICAL HISTORY:   Past Surgical History  Procedure Laterality Date  . Radical  prostate robotic     . Cataract extraction      SOCIAL HISTORY:   History   Social History  . Marital Status: Married    Spouse Name: N/A    Number of Children: N/A  . Years of Education: N/A   Occupational History  . Not on file.   Social History Main Topics  . Smoking status: Never Smoker   . Smokeless tobacco: Never Used  . Alcohol Use: No  . Drug Use: No  . Sexually Active: Not on file   Other Topics Concern  . Not on file   Social History Narrative  . No narrative on file    FAMILY HISTORY:   Family Status  Relation Status Death Age  . Maternal Grandmother Deceased 80    CAD    ROS:  A complete 10 system review of systems was obtained and was unremarkable apart from what is mentioned above.  PHYSICAL EXAMINATION:    VITALS:   Filed Vitals:   12/07/12 1356  BP: 92/68  Pulse: 72  Temp: 98 F (36.7 C)  Resp: 16  Weight: 203 lb (92.08 kg)  GEN:  Normal appears male in no acute distress.  Appears stated age. HEENT:  Normocephalic, atraumatic. The mucous membranes are moist. The superficial temporal arteries are without ropiness or tenderness. Cardiovascular: Regular rate and rhythm. Lungs: Clear to auscultation bilaterally. Neck/Heme: There are no carotid bruits noted bilaterally.  NEUROLOGICAL: Orientation:  The patient is alert and oriented x 3.  Fund of knowledge is appropriate.  Recent and remote memory intact.  Attention span and concentration normal.  Repeats and names without difficulty. Cranial nerves: There is good facial symmetry. The pupils are equal round and reactive to light bilaterally. Fundoscopic exam reveals clear disc margins bilaterally. Extraocular muscles are intact and visual fields are full to confrontational testing. Speech is fluent and clear. Soft palate rises symmetrically and there is no tongue deviation. Hearing is intact to conversational tone. Tone: Tone is good throughout. Sensation: Sensation is intact to light touch  and pinprick throughout (facial, trunk, extremities).  Pinprick is decreased in a stocking distribution bilaterally.  Vibration is intact at the bilateral big toe and only mildly decreased. There is no extinction with double simultaneous stimulation. There is no sensory dermatomal level identified. Coordination:  The patient has no difficulty with RAM's or FNF bilaterally. Motor: Strength is 5/5 in the bilateral upper and lower extremities. While grip strength is equal bilaterally, he has 4/5 weakness in the abductors of the fingers.  APB was strong b/l.  He is able to hold the "ok" sign with resistance without problems (ant interosseus n.).   Shoulder shrug is equal and symmetric. There is no pronator drift.  There are no fasciculations noted. DTR's: Deep tendon reflexes are 2/4 at the bilateral biceps, triceps, brachioradialis, 3/4 at the bilateral patella and trace at the bilateral achilles.  Plantar responses are downgoing bilaterally. Gait and Station: The patient is able to ambulate without difficulty. The patient is able to heel toe walk without any difficulty. The patient is able to ambulate in a tandem fashion with very minimal difficulty. The patient is able to stand in the Romberg position.  LABS:  Lab Results  Component Value Date   WBC 4.5 08/07/2009   HGB 14.6 08/07/2009   HCT 44.7 08/07/2009   MCV 92.8 08/07/2009   PLT 194.0 08/07/2009   Lab Results  Component Value Date   TSH 2.40 08/07/2009      IMPRESSION/PLAN  1. feet paresthesias and hand weakness  -He will have an EMG of the upper and lower extremities.  I think that the differential is quite great.  The differential goes all the way from peripheral neuropathy, multifocal mononeuropathy (carpal tunnel and radicular disease), and, less likely, postpolio.  I think the EMG should help [place] differentiate each of these.  If the EMG is unremarkable, then we can consider an MRI of the cervical spine.  He is mildly hyporeflexic.  -I.  am going to do a B12, folate, RPR, SPEP and UPEP with immunofixation.  I reviewed other labs from his primary care physician that were done in March, 2014.  At that time his glucose was normal at 90.  His thyroid was also normal.  -He is to call me about a week after his EMG has been performed so we can decide if we will proceed with further neuroimaging.

## 2012-12-12 LAB — PROTEIN ELECTROPHORESIS, SERUM
Alpha-1-Globulin: 6.2 % — ABNORMAL HIGH (ref 2.9–4.9)
Alpha-2-Globulin: 9 % (ref 7.1–11.8)
Beta 2: 4.6 % (ref 3.2–6.5)
Beta Globulin: 5.8 % (ref 4.7–7.2)
Gamma Globulin: 16.7 % (ref 11.1–18.8)

## 2012-12-12 LAB — UIFE/LIGHT CHAINS/TP QN, 24-HR UR
Alpha 2, Urine: DETECTED — AB
Beta, Urine: DETECTED — AB
Free Kappa Lt Chains,Ur: 3.81 mg/dL — ABNORMAL HIGH (ref 0.14–2.42)
Free Lambda Lt Chains,Ur: 0.29 mg/dL (ref 0.02–0.67)

## 2012-12-12 LAB — IMMUNOFIXATION ELECTROPHORESIS
IgA: 254 mg/dL (ref 68–379)
IgG (Immunoglobin G), Serum: 1110 mg/dL (ref 650–1600)
IgM, Serum: 162 mg/dL (ref 41–251)
Total Protein, Serum Electrophoresis: 6.4 g/dL (ref 6.0–8.3)
Total Protein, Serum Electrophoresis: 6.8 g/dL (ref 6.0–8.3)

## 2012-12-26 ENCOUNTER — Telehealth: Payer: Self-pay | Admitting: Neurology

## 2012-12-26 DIAGNOSIS — R202 Paresthesia of skin: Secondary | ICD-10-CM

## 2012-12-26 DIAGNOSIS — R94131 Abnormal electromyogram [EMG]: Secondary | ICD-10-CM

## 2012-12-26 NOTE — Telephone Encounter (Signed)
Left msg for pt to call. 

## 2012-12-26 NOTE — Telephone Encounter (Signed)
Please tell pt that I got results of EMG.  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  This doesn't really give [place] answers so I suggest we do an MRI of the c-spine and then have him f/u here with Dr. Allena Katz when she comes.

## 2012-12-28 NOTE — Telephone Encounter (Signed)
Pt aware, would like to go ahead with the MRI, will schedule and inform pt.  June 27th at 10:00am [facility].

## 2013-01-11 ENCOUNTER — Ambulatory Visit (HOSPITAL_COMMUNITY): Payer: BC Managed Care – PPO

## 2013-01-14 ENCOUNTER — Encounter: Payer: Self-pay | Admitting: Neurology

## 2014-05-16 ENCOUNTER — Ambulatory Visit: Payer: BC Managed Care – PPO | Admitting: Internal Medicine

## 2014-06-05 ENCOUNTER — Encounter: Payer: Self-pay | Admitting: Internal Medicine

## 2014-06-05 ENCOUNTER — Ambulatory Visit (INDEPENDENT_AMBULATORY_CARE_PROVIDER_SITE_OTHER): Payer: Medicare Other | Admitting: Internal Medicine

## 2014-06-05 VITALS — BP 118/70 | HR 71 | Ht 72.0 in | Wt 203.0 lb

## 2014-06-05 DIAGNOSIS — R0789 Other chest pain: Secondary | ICD-10-CM

## 2014-06-05 DIAGNOSIS — I493 Ventricular premature depolarization: Secondary | ICD-10-CM

## 2014-06-05 DIAGNOSIS — Z01810 Encounter for preprocedural cardiovascular examination: Secondary | ICD-10-CM

## 2014-06-05 NOTE — Progress Notes (Signed)
Patient Care Team: [name], MD as PCP - General (Internal Medicine)   HPI  Derek Cervantes is a 62 y.o. male seen in followup for PVCs for which he underwent ablation at [facility] with Jerold Coombe  And it had no significant recurrence of palpitations.   He continues with some atypical chest discomfort. He has abated with the use of PPIs. He had a normal   Myoview scan 2010  He is not physically fit. 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  Has had no syncope palpitations or peripheral edema or shortness of breath.   He is anticipating knee replacement surgery.        Past Medical History  Diagnosis Date  . Ventricular tachycardia     s/p ablation at the [place] [facility]  . Cardiomyopathy     PVC related  . Prostate cancer 2010    s/p robotic surgery - stage 3  . Dyspnea     recumbent  . Limb tremor     hand tremors unassociated with palpitation  . Polio     as a child  . Diverticulosis   . PPD positive 1970  . ED (erectile dysfunction)     Past Surgical History  Procedure Laterality Date  . Radical prostate robotic     . Cataract extraction Left   . Tonsillectomy      Current Outpatient Prescriptions  Medication Sig Dispense Refill  . ascorbic acid (VITAMIN C) 500 MG tablet Take 500 mg by mouth daily.      . lansoprazole (PREVACID) 15 MG capsule Take 15 mg by mouth daily.    . meloxicam (MOBIC) 7.5 MG tablet Take by mouth daily.     Marland Kitchen NITROSTAT 0.4 MG SL tablet Take as needed     No current facility-administered medications for this visit.    Allergies  Allergen Reactions  . Flecainide Acetate     REACTION: intolerance  . Propafenone Hcl     REACTION: intolerance  . Sotalol Hcl     REACTION: intolerance    Review of Systems negative except from HPI and PMH  Physical Exam BP 118/70 mmHg  Pulse 71  Ht 6' (1.829 m)  Wt 92.08 kg (203 lb)  BMI 27.53 kg/m2 Well  developed and nourished in no acute distress HENT normal Neck supple with JVP-flat Clear Regular rate and rhythm, no murmurs or gallops Abd-soft with active BS No Clubbing cyanosis edema Skin-warm and dry A & Oriented  Grossly normal sensory  He has obvious weakness in his hands.  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 His atypical chest pain continues to be responsive to PPIs  He is scheduled to undergo hip replacement surgery. I think his cardiovascular risks are acceptable. T\In the event that he would require beta blockers,I don't think there is anycontraindication to this use.

## 2014-06-05 NOTE — Patient Instructions (Signed)
Your physician recommends that you continue on your current medications as directed. Please refer to the Current Medication list given to you today.  Your physician wants you to follow-up in: 1 year with Dr. Klein.  You will receive a reminder letter in the mail two months in advance. If you don't receive a letter, please call our office to schedule the follow-up appointment.  

## 2015-09-24 ENCOUNTER — Encounter: Payer: Self-pay | Admitting: Internal Medicine

## 2015-09-24 ENCOUNTER — Ambulatory Visit (INDEPENDENT_AMBULATORY_CARE_PROVIDER_SITE_OTHER): Payer: Medicare Other | Admitting: Internal Medicine

## 2015-09-24 VITALS — BP 118/70 | HR 76 | Ht 72.0 in | Wt 201.0 lb

## 2015-09-24 DIAGNOSIS — I493 Ventricular premature depolarization: Secondary | ICD-10-CM | POA: Diagnosis not present

## 2015-09-24 NOTE — Progress Notes (Signed)
Patient Care Team: [name], MD as PCP - General (Internal Medicine)   HPI  Derek Cervantes is a 65 y.o. male seen in followup for PVCs for which he underwent ablation at [facility] with Jerold Coombe  And it had no significant recurrence of palpitations. He had a normal   Myoview scan 2010   He is doing well. He has no cardiovascular complaints. The pain is much better following hip  replacement surgery  Has had no syncope palpitations or peripheral edema or shortness of breath.       Past Medical History  Diagnosis Date  . Ventricular tachycardia ([place])     s/p ablation at the [place] [facility]  . Cardiomyopathy     PVC related  . Prostate cancer ([place]) 2010    s/p robotic surgery - stage 3  . Dyspnea     recumbent  . Limb tremor     hand tremors unassociated with palpitation  . Polio     as a child  . Diverticulosis   . PPD positive 1970  . ED (erectile dysfunction)     Past Surgical History  Procedure Laterality Date  . Radical prostate robotic     . Cataract extraction Left   . Tonsillectomy      Current Outpatient Prescriptions  Medication Sig Dispense Refill  . ascorbic acid (VITAMIN C) 500 MG tablet Take 500 mg by mouth daily.      Marland Kitchen NITROSTAT 0.4 MG SL tablet Take as needed     No current facility-administered medications for this visit.    Allergies  Allergen Reactions  . Flecainide Acetate     REACTION: intolerance  . Propafenone Hcl     REACTION: intolerance  . Sotalol Hcl     REACTION: intolerance    Review of Systems negative except from HPI and PMH  Physical Exam BP 118/70 mmHg  Pulse 76  Ht 6' (1.829 m)  Wt 201 lb (91.173 kg)  BMI 27.25 kg/m2 Well developed and nourished in no acute distress HENT normal Neck supple with JVP-flat Clear Regular rate and rhythm, no murmurs or gallops Abd-soft with active BS No Clubbing cyanosis edema Skin-warm and dry A & Oriented  Grossly normal sensory  He has obvious weakness in his  hands.  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  No active cardiovascular issues.

## 2016-12-23 ENCOUNTER — Ambulatory Visit (INDEPENDENT_AMBULATORY_CARE_PROVIDER_SITE_OTHER): Payer: Medicare Other | Admitting: Internal Medicine

## 2016-12-23 ENCOUNTER — Encounter (INDEPENDENT_AMBULATORY_CARE_PROVIDER_SITE_OTHER): Payer: Self-pay

## 2016-12-23 ENCOUNTER — Encounter: Payer: Self-pay | Admitting: Internal Medicine

## 2016-12-23 VITALS — BP 106/68 | HR 81 | Ht 72.0 in | Wt 203.0 lb

## 2016-12-23 DIAGNOSIS — I4510 Unspecified right bundle-branch block: Secondary | ICD-10-CM

## 2016-12-23 DIAGNOSIS — I493 Ventricular premature depolarization: Secondary | ICD-10-CM | POA: Diagnosis not present

## 2016-12-23 NOTE — Patient Instructions (Signed)

## 2016-12-23 NOTE — Progress Notes (Signed)
Patient Care Team: [name], MD as PCP - General (Internal Medicine)   HPI  Arzell Mcgeehan is a 66 y.o. male seen in followup for PVCs for which he underwent ablation at [facility] with Jerold Coombe  He has had no significant recurrence of palpitations. He had a normal  Myoview scan 2010   The patient denies chest pain, shortness of breath, nocturnal dyspnea, orthopnea or peripheral edema.  There have been no palpitations, lightheadedness or syncope.   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 Cardiomyopathy    PVC related  . Diverticulosis   . Dyspnea    recumbent  . ED (erectile dysfunction)   . Limb tremor    hand tremors unassociated with palpitation  . Polio    as a child  . PPD positive 1970  . Prostate cancer ([place]) 2010   s/p robotic surgery - stage 3  . Ventricular tachycardia [facility])    s/p ablation at the [place] [facility]    Past Surgical History:  Procedure Laterality Date  . CATARACT EXTRACTION Left   . radical prostate robotic     . TONSILLECTOMY      Current Outpatient Prescriptions  Medication Sig Dispense Refill  . ascorbic acid (VITAMIN C) 500 MG tablet Take 500 mg by mouth daily.      Marland Kitchen NITROSTAT 0.4 MG SL tablet Take as needed     No current facility-administered medications for this visit.     Allergies  Allergen Reactions  . Flecainide Acetate     REACTION: intolerance  . Propafenone Hcl     REACTION: intolerance  . 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

## 2018-04-13 ENCOUNTER — Ambulatory Visit: Payer: Medicare Other | Admitting: Internal Medicine

## 2018-06-01 ENCOUNTER — Encounter: Payer: Self-pay | Admitting: Internal Medicine

## 2018-06-01 ENCOUNTER — Ambulatory Visit (INDEPENDENT_AMBULATORY_CARE_PROVIDER_SITE_OTHER): Payer: Medicare Other | Admitting: Internal Medicine

## 2018-06-01 VITALS — BP 130/82 | HR 63 | Ht 72.0 in | Wt 201.2 lb

## 2018-06-01 DIAGNOSIS — I4510 Unspecified right bundle-branch block: Secondary | ICD-10-CM

## 2018-06-01 DIAGNOSIS — I493 Ventricular premature depolarization: Secondary | ICD-10-CM | POA: Diagnosis not present

## 2018-06-01 NOTE — Progress Notes (Signed)
Patient Care Team: [name], MD as PCP - General (Internal Medicine)   HPI  Derek Cervantes is a 67 y.o. male seen in followup for PVCs for which he underwent ablation at [facility] with Jerold Coombe    He had a normal  Myoview scan 2010   No interval PVCs of which she is aware.  He is scheduled for hip surgery early December at [facility].  Functional status is limited by his hip; however, he is able to climb stairs.  Dyspnea is not limiting.  No interval chest pain.  Past Medical History:  Diagnosis Date  . Cardiomyopathy    PVC related  . Diverticulosis   . Dyspnea    recumbent  . ED (erectile dysfunction)   . Limb tremor    hand tremors unassociated with palpitation  . Polio    as a child  . PPD positive 1970  . Prostate cancer ([place]) 2010   s/p robotic surgery - stage 3  . Ventricular tachycardia [facility])    s/p ablation at the [place] [facility]    Past Surgical History:  Procedure Laterality Date  . CATARACT EXTRACTION Left   . radical prostate robotic     . TONSILLECTOMY      Current Outpatient Medications  Medication Sig Dispense Refill  . ascorbic acid (VITAMIN C) 500 MG tablet Take 500 mg by mouth daily.      Marland Kitchen NITROSTAT 0.4 MG SL tablet Place 0.4 mg under the tongue every 5 (five) minutes as needed for chest pain. Take as needed     No current facility-administered medications for this visit.     Allergies  Allergen Reactions  . Flecainide Acetate     REACTION: intolerance  . Propafenone Hcl     REACTION: intolerance  . Sotalol Hcl     REACTION: intolerance    Review of Systems negative except from HPI and PMH  Physical Exam BP 130/82   Pulse 63   Ht 6' (1.829 m)   Wt 201 lb 3.2 oz (91.3 kg)   SpO2 97%   BMI 27.29 kg/m  Well developed and nourished in no acute distress HENT normal Neck supple with JVP-flat Clear Regular rate and rhythm, no murmurs or gallops Abd-soft with active BS No Clubbing cyanosis edema Skin-warm and dry A &  Oriented  Grossly normal sensory and motor function    ECG demonstrate sinus rhythm at 63 beats at intervals 14/14/45 Axis I 41   Assessment and  Plan  PVC history of ablation at the [facility] clinic  Right bundle branch/left posterior fascicular block  Electrocardiogram is unchanged.  Functional status is quite good despite limitations by his hip.  Cardiac risk for his surgery is acceptable.  Perioperative beta-blockers could be considered given his history of ventricular ectopy

## 2019-03-22 ENCOUNTER — Encounter: Payer: Self-pay | Admitting: Internal Medicine

## 2019-05-10 ENCOUNTER — Ambulatory Visit (INDEPENDENT_AMBULATORY_CARE_PROVIDER_SITE_OTHER): Payer: Medicare Other | Admitting: Internal Medicine

## 2019-05-10 ENCOUNTER — Other Ambulatory Visit: Payer: Self-pay

## 2019-05-10 ENCOUNTER — Encounter: Payer: Self-pay | Admitting: Internal Medicine

## 2019-05-10 VITALS — BP 124/76 | HR 71 | Ht 72.0 in | Wt 196.8 lb

## 2019-05-10 DIAGNOSIS — I493 Ventricular premature depolarization: Secondary | ICD-10-CM

## 2019-05-10 DIAGNOSIS — I4510 Unspecified right bundle-branch block: Secondary | ICD-10-CM

## 2019-05-10 NOTE — Progress Notes (Signed)
Patient Care Team: [name], MD as PCP - General (Internal Medicine)   HPI  Derek Cervantes is a 68 y.o. male seen in followup for PVCs for which he underwent ablation at [facility] with Jerold Coombe    He had a normal  Myoview scan 2010  Few PVCs  The patient denies chest pain, shortness of breath, nocturnal dyspnea, orthopnea or peripheral edema.  There have been no lightheadedness or syncope.   Fair amount of stress   Past Medical History:  Diagnosis Date  . Cardiomyopathy    PVC related  . Diverticulosis   . Dyspnea    recumbent  . ED (erectile dysfunction)   . Limb tremor    hand tremors unassociated with palpitation  . Polio    as a child  . PPD positive 1970  . Prostate cancer ([place]) 2010   s/p robotic surgery - stage 3  . Ventricular tachycardia [facility])    s/p ablation at the [place] [facility]    Past Surgical History:  Procedure Laterality Date  . CATARACT EXTRACTION Left   . radical prostate robotic     . TONSILLECTOMY      Current Outpatient Medications  Medication Sig Dispense Refill  . ascorbic acid (VITAMIN C) 500 MG tablet Take 500 mg by mouth daily.      . Multiple Vitamins-Minerals (ZINC PO) Take 1 tablet by mouth daily.    Marland Kitchen NITROSTAT 0.4 MG SL tablet Place 0.4 mg under the tongue every 5 (five) minutes as needed for chest pain. Take as needed    . VITAMIN D PO Take 1 tablet by mouth daily.     No current facility-administered medications for this visit.     Allergies  Allergen Reactions  . Flecainide Acetate     REACTION: intolerance  . Propafenone Hcl     REACTION: intolerance  . Sotalol Hcl     REACTION: intolerance    Review of Systems negative except from HPI and PMH  Physical Exam BP 124/76   Pulse 71   Ht 6' (1.829 m)   Wt 196 lb 12.8 oz (89.3 kg)   SpO2 98%   BMI 26.69 kg/m  Well developed and nourished in no acute distress HENT normal Neck supple with JVP-  flat   Clear Regular rate and rhythm, no murmurs or  gallops Abd-soft with active BS No Clubbing cyanosis edema Skin-warm and dry A & Oriented  Grossly normal sensory and motor function  ECG sinus 71 13/14/44 Axis rightward unchanged   Assessment and  Plan  PVC history of ablation at the [facility] clinic  Right bundle branch/left posterior fascicular block  ECG unchangded .  Functional status is better and hip did well

## 2019-05-10 NOTE — Patient Instructions (Signed)

## 2019-07-19 HISTORY — PX: CARPAL TUNNEL RELEASE: SHX101

## 2019-08-19 DIAGNOSIS — U071 COVID-19: Secondary | ICD-10-CM

## 2019-08-19 HISTORY — DX: COVID-19: U07.1

## 2020-04-10 ENCOUNTER — Encounter: Payer: Self-pay | Admitting: *Deleted

## 2020-04-16 NOTE — Progress Notes (Signed)
PCP:  [name], MD Primary Cardiologist: No primary care provider on file. Electrophysiologist: Derek Axe, MD   Derek Cervantes is a 69 y.o. male seen today for Derek Axe, MD for acute visit due to recurrent PVCs.  Since last being seen in our clinic the patient reports doing well overall. In September he had a couple of days of intermittent palpitations in the setting of GI upset. Denies nausea, vomiting, or diarrhea, just a vague epigastric discomfort that has since resolved and not returned. He has been asymptomatic over the past week. He denies chest pain, dyspnea, PND, orthopnea, nausea, vomiting, dizziness, syncope, edema, weight gain, or early satiety.  Past Medical History:  Diagnosis Date  . Cardiomyopathy    PVC related  . COVID-19 08/2019  . Diverticulosis   . Dyspnea    recumbent  . ED (erectile dysfunction)   . Limb tremor    hand tremors unassociated with palpitation  . Osteoarthritis   . Polio    as a child  . PPD positive 1970  . Prostate cancer ([place]) 2010   s/p robotic surgery - stage 3  . Ventricular tachycardia [facility])    s/p ablation at the [place] clinic   Past Surgical History:  Procedure Laterality Date  . CARDIAC ELECTROPHYSIOLOGY STUDY AND ABLATION     x 2  . CARPAL TUNNEL RELEASE Right 2021  . CATARACT EXTRACTION, BILATERAL    . HIP ARTHROPLASTY Right   . ROBOT ASSISTED LAPAROSCOPIC RADICAL PROSTATECTOMY    . TONSILLECTOMY    . TOTAL HIP ARTHROPLASTY Left     Current Outpatient Medications  Medication Sig Dispense Refill  . ascorbic acid (VITAMIN C) 500 MG tablet Take 500 mg by mouth daily.      Marland Kitchen azithromycin (ZITHROMAX) 250 MG tablet Take 250 mg by mouth daily.    . Multiple Vitamins-Minerals (ZINC PO) Take 1 tablet by mouth daily.    Marland Kitchen NITROSTAT 0.4 MG SL tablet Place 0.4 mg under the tongue every 5 (five) minutes as needed for chest pain. Take as needed    . pantoprazole (PROTONIX) 40 MG tablet Take 40 mg by mouth daily.    .  sucralfate (CARAFATE) 1 g tablet Take by mouth as needed.    Marland Kitchen VITAMIN D PO Take 1 tablet by mouth daily.    . Zinc Methionate 50 MG CAPS Take 1 capsule by mouth daily.     No current facility-administered medications for this visit.    Allergies  Allergen Reactions  . Flecainide Acetate     REACTION: intolerance  . Propafenone Hcl     REACTION: intolerance  . Sotalol Hcl     REACTION: intolerance    Social History   Socioeconomic History  . Marital status: Married    Spouse name: Not on file  . Number of children: Not on file  . Years of education: Not on file  . Highest education level: Not on file  Occupational History    Employer: SELF-EMPLOYED    Comment: [employer]  Tobacco Use  . Smoking status: Never Smoker  . Smokeless tobacco: Never Used  Substance and Sexual Activity  . Alcohol use: No  . Drug use: No  . Sexual activity: Not on file  Other Topics Concern  . Not on file  Social History Narrative  . Not on file   Social Determinants of Health   Financial Resource Strain:   . Difficulty of Paying Living Expenses: Not on file  Food Insecurity:   .  Worried About Charity fundraiser in the Last Year: Not on file  . Ran Out of Food in the Last Year: Not on file  Transportation Needs:   . Lack of Transportation (Medical): Not on file  . Lack of Transportation (Non-Medical): Not on file  Physical Activity:   . Days of Exercise per Week: Not on file  . Minutes of Exercise per Session: Not on file  Stress:   . Feeling of Stress : Not on file  Social Connections:   . Frequency of Communication with Friends and Family: Not on file  . Frequency of Social Gatherings with Friends and Family: Not on file  . Attends Religious Services: Not on file  . Active Member of Clubs or Organizations: Not on file  . Attends Archivist Meetings: Not on file  . Marital Status: Not on file  Intimate Partner Violence:   . Fear of Current or Ex-Partner: Not on file  .  Emotionally Abused: Not on file  . Physically Abused: Not on file  . Sexually Abused: Not on file     Review of Systems: All other systems reviewed and are otherwise negative except as noted above.  Physical Exam: Vitals:   04/17/20 1146  BP: 112/68  Pulse: 70  SpO2: 97%  Weight: 202 lb (91.6 kg)  Height: 6' (1.829 m)    GEN- The patient is well appearing, alert and oriented x 3 today.   HEENT: normocephalic, atraumatic; sclera clear, conjunctiva pink; hearing intact; oropharynx clear; neck supple, no JVP Lymph- no cervical lymphadenopathy Lungs- Clear to ausculation bilaterally, normal work of breathing.  No wheezes, rales, rhonchi Heart- Regular rate and rhythm, no murmurs, rubs or gallops, PMI not laterally displaced GI- soft, non-tender, non-distended, bowel sounds present, no hepatosplenomegaly Extremities- no clubbing, cyanosis, or edema; DP/PT/radial pulses 2+ bilaterally MS- no significant deformity or atrophy Skin- warm and dry, no rash or lesion Psych- euthymic mood, full affect Neuro- strength and sensation are intact  EKG is ordered. Personal review of EKG from today shows NSR at 70 bpm. No PVCs  Additional studies reviewed include: Previous EP office notes  Assessment and Plan:  1. PVCs H/o ablation at the [facility] clinic by Dr. Jerold Coombe Normal myoview scan 2010 He had breakthrough episode in September in the setting of GI upset. No further over the past week.  Will provide Lopressor 25 mg tablet to take as needed (starting with 12.5) for further breakthrough.   2. RBBB/LPFB EKG stable.   He has call back in December with Dr. Caryl Comes which I offered to move back. He will discuss with his wife who usually has appointments in  around the same time as his. If patient decides, would be perfectly reasonable to push back his follow up in the 6-12 month range from today's visit. He says he will let us know. Pt knows to call back with any worsening  symptoms.   Shirley Friar, PA-C  04/17/20 12:00 PM

## 2020-04-17 ENCOUNTER — Encounter: Payer: Self-pay | Admitting: Student

## 2020-04-17 ENCOUNTER — Other Ambulatory Visit: Payer: Self-pay

## 2020-04-17 ENCOUNTER — Ambulatory Visit (INDEPENDENT_AMBULATORY_CARE_PROVIDER_SITE_OTHER): Payer: Medicare Other | Admitting: Student

## 2020-04-17 VITALS — BP 112/68 | HR 70 | Ht 72.0 in | Wt 202.0 lb

## 2020-04-17 DIAGNOSIS — I493 Ventricular premature depolarization: Secondary | ICD-10-CM | POA: Diagnosis not present

## 2020-04-17 DIAGNOSIS — I4510 Unspecified right bundle-branch block: Secondary | ICD-10-CM

## 2020-04-17 MED ORDER — METOPROLOL TARTRATE 25 MG PO TABS: 12.5000 mg | ORAL_TABLET | Freq: Every day | ORAL | 1 refills | Status: DC | PRN

## 2020-04-17 NOTE — Patient Instructions (Addendum)
Medication Instructions:  Your physician has recommended you make the following change in your medication:  -- START Metoprolol Tartrate (Lopressor) 25 mg - Take 0.5 (12.5 mg) tablet by mouth daily as needed for palpitation  Follow-Up: At [facility], you and your health needs are our priority.  As part of our continuing mission to provide you with exceptional heart care, we have created designated Provider Care Teams.  These Care Teams include your primary Cardiologist (physician) and Advanced Practice Providers (APPs -  Physician Assistants and Nurse Practitioners) who all work together to provide you with the care you need, when you need it.  We recommend signing up for the patient portal called "MyChart".  Sign up information is provided on this After Visit Summary.  MyChart is used to connect with patients for Virtual Visits (Telemedicine).  Patients are able to view lab/test results, encounter notes, upcoming appointments, etc.  Non-urgent messages can be sent to your provider as well.   To learn more about what you can do with MyChart, go to [email or web address].    Your next appointment:   Your physician recommends that you schedule a follow-up appointment in: December 2021 with Dr. Caryl Comes.   The format for your next appointment:   In Person with Virl Axe, MD    DASH Eating Plan DASH stands for "Dietary Approaches to Stop Hypertension." The DASH eating plan is a healthy eating plan that has been shown to reduce high blood pressure (hypertension). It may also reduce your risk for type 2 diabetes, heart disease, and stroke. The DASH eating plan may also help with weight loss. What are tips for following this plan?  General guidelines  Avoid eating more than 2,300 mg (milligrams) of salt (sodium) a day. If you have hypertension, you may need to reduce your sodium intake to 1,500 mg a day.  Limit alcohol intake to no more than 1 drink a day for nonpregnant women and 2 drinks a  day for men. One drink equals 12 oz of beer, 5 oz of wine, or 1 oz of hard liquor.  Work with your health care provider to maintain a healthy body weight or to lose weight. Ask what an ideal weight is for you.  Get at least 30 minutes of exercise that causes your heart to beat faster (aerobic exercise) most days of the week. Activities may include walking, swimming, or biking.  Work with your health care provider or diet and nutrition specialist (dietitian) to adjust your eating plan to your individual calorie needs. Reading food labels   Check food labels for the amount of sodium per serving. Choose foods with less than 5 percent of the Daily Value of sodium. Generally, foods with less than 300 mg of sodium per serving fit into this eating plan.  To find whole grains, look for the word "whole" as the first word in the ingredient list. Shopping  Buy products labeled as "low-sodium" or "no salt added."  Buy fresh foods. Avoid canned foods and premade or frozen meals. Cooking  Avoid adding salt when cooking. Use salt-free seasonings or herbs instead of table salt or sea salt. Check with your health care provider or pharmacist before using salt substitutes.  Do not fry foods. Cook foods using healthy methods such as baking, boiling, grilling, and broiling instead.  Cook with heart-healthy oils, such as olive, canola, soybean, or sunflower oil. Meal planning  Eat a balanced diet that includes: ? 5 or more servings of fruits and vegetables each day.  At each meal, try to fill half of your plate with fruits and vegetables. ? Up to 6-8 servings of whole grains each day. ? Less than 6 oz of lean meat, poultry, or fish each day. A 3-oz serving of meat is about the same size as a deck of cards. One egg equals 1 oz. ? 2 servings of low-fat dairy each day. ? A serving of nuts, seeds, or beans 5 times each week. ? Heart-healthy fats. Healthy fats called Omega-3 fatty acids are found in foods such  as flaxseeds and coldwater fish, like sardines, salmon, and mackerel.  Limit how much you eat of the following: ? Canned or prepackaged foods. ? Food that is high in trans fat, such as fried foods. ? Food that is high in saturated fat, such as fatty meat. ? Sweets, desserts, sugary drinks, and other foods with added sugar. ? Full-fat dairy products.  Do not salt foods before eating.  Try to eat at least 2 vegetarian meals each week.  Eat more home-cooked food and less restaurant, buffet, and fast food.  When eating at a restaurant, ask that your food be prepared with less salt or no salt, if possible. What foods are recommended? The items listed may not be a complete list. Talk with your dietitian about what dietary choices are best for you. Grains Whole-grain or whole-wheat bread. Whole-grain or whole-wheat pasta. Brown rice. [place]. Bulgur. Whole-grain and low-sodium cereals. Pita bread. Low-fat, low-sodium crackers. Whole-wheat flour tortillas. Vegetables Fresh or frozen vegetables (raw, steamed, roasted, or grilled). Low-sodium or reduced-sodium tomato and vegetable juice. Low-sodium or reduced-sodium tomato sauce and tomato paste. Low-sodium or reduced-sodium canned vegetables. Fruits All fresh, dried, or frozen fruit. Canned fruit in natural juice (without added sugar). Meat and other protein foods Skinless chicken or [place]. Ground chicken or [place]. Pork with fat trimmed off. Fish and seafood. Egg whites. Dried beans, peas, or lentils. Unsalted nuts, nut butters, and seeds. Unsalted canned beans. Lean cuts of beef with fat trimmed off. Low-sodium, lean deli meat. Dairy Low-fat (1%) or fat-free (skim) milk. Fat-free, low-fat, or reduced-fat cheeses. Nonfat, low-sodium ricotta or cottage cheese. Low-fat or nonfat yogurt. Low-fat, low-sodium cheese. Fats and oils Soft margarine without trans fats. Vegetable oil. Low-fat, reduced-fat, or light mayonnaise and salad dressings  (reduced-sodium). Canola, safflower, olive, soybean, and sunflower oils. Avocado. Seasoning and other foods Herbs. Spices. Seasoning mixes without salt. Unsalted popcorn and pretzels. Fat-free sweets. What foods are not recommended? The items listed may not be a complete list. Talk with your dietitian about what dietary choices are best for you. Grains Baked goods made with fat, such as croissants, muffins, or some breads. Dry pasta or rice meal packs. Vegetables Creamed or fried vegetables. Vegetables in a cheese sauce. Regular canned vegetables (not low-sodium or reduced-sodium). Regular canned tomato sauce and paste (not low-sodium or reduced-sodium). Regular tomato and vegetable juice (not low-sodium or reduced-sodium). Angie Fava. Olives. Fruits Canned fruit in a light or heavy syrup. Fried fruit. Fruit in cream or butter sauce. Meat and other protein foods Fatty cuts of meat. Ribs. Fried meat. Berniece Salines. Sausage. Bologna and other processed lunch meats. Salami. Fatback. Hotdogs. Bratwurst. Salted nuts and seeds. Canned beans with added salt. Canned or smoked fish. Whole eggs or egg yolks. Chicken or [place] with skin. Dairy Whole or 2% milk, cream, and half-and-half. Whole or full-fat cream cheese. Whole-fat or sweetened yogurt. Full-fat cheese. Nondairy creamers. Whipped toppings. Processed cheese and cheese spreads. Fats and oils Butter. Stick margarine.  Lard. Shortening. Ghee. Bacon fat. Tropical oils, such as coconut, palm kernel, or palm oil. Seasoning and other foods Salted popcorn and pretzels. Onion salt, garlic salt, seasoned salt, table salt, and sea salt. Worcestershire sauce. Tartar sauce. Barbecue sauce. Teriyaki sauce. Soy sauce, including reduced-sodium. Steak sauce. Canned and packaged gravies. Fish sauce. Oyster sauce. Cocktail sauce. Horseradish that you find on the shelf. Ketchup. Mustard. Meat flavorings and tenderizers. Bouillon cubes. Hot sauce and Tabasco sauce. Premade or  packaged marinades. Premade or packaged taco seasonings. Relishes. Regular salad dressings. Where to find more information:  National Heart, Lung, and [place]: https://wilson-eaton.com/  American Heart Association: www.heart.org Summary  The DASH eating plan is a healthy eating plan that has been shown to reduce high blood pressure (hypertension). It may also reduce your risk for type 2 diabetes, heart disease, and stroke.  With the DASH eating plan, you should limit salt (sodium) intake to 2,300 mg a day. If you have hypertension, you may need to reduce your sodium intake to 1,500 mg a day.  When on the DASH eating plan, aim to eat more fresh fruits and vegetables, whole grains, lean proteins, low-fat dairy, and heart-healthy fats.  Work with your health care provider or diet and nutrition specialist (dietitian) to adjust your eating plan to your individual calorie needs. This information is not intended to replace advice given to you by your health care provider. Make sure you discuss any questions you have with your health care provider. Document Revised: 06/16/2017 Document Reviewed: 06/27/2016 Elsevier Patient Education  2020 [place] or increased heart rate -- RX SENT TO PHARMACY --  *If you need a refill on your cardiac medications before your next appointment, please call your pharmacy*

## 2020-05-11 ENCOUNTER — Ambulatory Visit (INDEPENDENT_AMBULATORY_CARE_PROVIDER_SITE_OTHER): Payer: Medicare Other | Admitting: Internal Medicine

## 2020-05-11 ENCOUNTER — Ambulatory Visit (INDEPENDENT_AMBULATORY_CARE_PROVIDER_SITE_OTHER): Payer: Medicare Other | Admitting: Pulmonary Disease

## 2020-05-11 ENCOUNTER — Encounter: Payer: Self-pay | Admitting: Pulmonary Disease

## 2020-05-11 ENCOUNTER — Encounter: Payer: Self-pay | Admitting: Internal Medicine

## 2020-05-11 ENCOUNTER — Other Ambulatory Visit: Payer: Self-pay

## 2020-05-11 VITALS — BP 126/72 | HR 70 | Ht 72.0 in | Wt 205.0 lb

## 2020-05-11 VITALS — BP 130/80 | HR 61 | Temp 98.3°F | Ht 73.0 in | Wt 205.0 lb

## 2020-05-11 DIAGNOSIS — R1319 Other dysphagia: Secondary | ICD-10-CM | POA: Diagnosis not present

## 2020-05-11 DIAGNOSIS — R1013 Epigastric pain: Secondary | ICD-10-CM | POA: Diagnosis not present

## 2020-05-11 DIAGNOSIS — R0600 Dyspnea, unspecified: Secondary | ICD-10-CM | POA: Diagnosis not present

## 2020-05-11 DIAGNOSIS — R140 Abdominal distension (gaseous): Secondary | ICD-10-CM

## 2020-05-11 DIAGNOSIS — K219 Gastro-esophageal reflux disease without esophagitis: Secondary | ICD-10-CM | POA: Diagnosis not present

## 2020-05-11 NOTE — Addendum Note (Signed)
Addended by: Gavin Potters R on: 05/11/2020 01:00 PM   Modules accepted: Orders

## 2020-05-11 NOTE — Progress Notes (Signed)
Patient ID: Derek Cervantes, male   DOB: Mar 31, 1951, 69 y.o.   MRN: [id number] HPI: Derek Cervantes is a 69 year old male with a past medical history of GERD, esophageal web, prostate cancer status post robotic prostatectomy, history of arrhythmia status post ablation, diverticulosis and COVID-19 in January 2021 who is seen to discuss GERD, epigastric pain and bloating as well as dysphagia.  He is here today with his wife.  He is known to our practice from 2011 when he had an upper endoscopy performed by Dr. Sharlett Iles.  This revealed a distal esophageal web.  Esophageal biopsies were benign without evidence of eosinophilic esophagitis or Barrett's esophagus.  The stomach and small bowel were normal and biopsies of the stomach and small bowel also were unremarkable.  No H. pylori.  No celiac.  He had a colonoscopy performed in 2019 for screening at a different practice.  He reports that he is dealing with abdominal bloating as well as epigastric discomfort.  This has been on and off for some time and initially was evaluated by cardiology to ensure that this was not cardiac in nature.  He has been on pantoprazole 40 mg daily since January when he was diagnosed with COVID-19.  This medication has seemed to help with his heartburn symptoms and also epigastric discomfort.  He still has frequent abdominal bloating symptom particularly after meals.  At times the bloating is noticeable and to the extent that it makes him feel somewhat short of breath or as if he cannot take a full deep breath.  He has more upper GI gas than lower GI gas including belching.  He has some mild heartburn and also dysphagia for solid food and pills.  Bowel movements have been daily though he did struggle with constipation for several months after COVID-19 but more recently without medication they have normalized.  He is now having a bowel movement on a daily basis.  No blood or melena.  He did try lactose-free diet which did not seem to help much  with bloating.  He intermittently uses Carafate which also helps with some of the epigastric discomfort.  Past Medical History:  Diagnosis Date  . 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 COVID-19 08/2019  . Diverticulosis   . Dyspnea    recumbent  . ED (erectile dysfunction)   . Limb tremor    hand tremors unassociated with palpitation  . Osteoarthritis   . Polio    as a child  . PPD positive 1970  . Prostate cancer ([place]) 2010   s/p robotic surgery - stage 3  . Ventricular tachycardia [facility])    s/p ablation at the [place] clinic    Past Surgical History:  Procedure Laterality Date  . CARDIAC ELECTROPHYSIOLOGY STUDY AND ABLATION     x 2  . CARPAL TUNNEL RELEASE Right 2021  . CATARACT EXTRACTION, BILATERAL    . HIP ARTHROPLASTY Right   . ROBOT ASSISTED LAPAROSCOPIC RADICAL PROSTATECTOMY    . TONSILLECTOMY    . TOTAL HIP ARTHROPLASTY Left     Outpatient Medications Prior to Visit  Medication Sig Dispense Refill  . ascorbic acid (VITAMIN C) 500 MG tablet Take 500 mg by mouth daily.      . metoprolol tartrate (LOPRESSOR) 25 MG tablet Take 0.5 tablets (12.5 mg total) by mouth daily as needed (palpitations or increased heart rate). 30 tablet 1  . Multiple Vitamins-Minerals (ZINC PO) Take 1 tablet by mouth daily.    Marland Kitchen NITROSTAT 0.4 MG SL tablet  Place 0.4 mg under the tongue every 5 (five) minutes as needed for chest pain. Take as needed    . pantoprazole (PROTONIX) 40 MG tablet Take 40 mg by mouth daily.    . sucralfate (CARAFATE) 1 g tablet Take by mouth as needed.    Marland Kitchen VITAMIN D PO Take 1 tablet by mouth daily.    . Zinc Methionate 50 MG CAPS Take 1 capsule by mouth daily.     No facility-administered medications prior to visit.    Allergies  Allergen Reactions  . Flecainide Acetate     REACTION: intolerance  . Propafenone Hcl     REACTION: intolerance  . Sotalol Hcl     REACTION: intolerance    Family History  Problem Relation Age of Onset  . Coronary artery  disease Maternal Grandmother   . Bladder Cancer Father   . Colon cancer Neg Hx   . Esophageal cancer Neg Hx   . Stomach cancer Neg Hx     Social History   Tobacco Use  . Smoking status: Never Smoker  . Smokeless tobacco: Never Used  Vaping Use  . Vaping Use: Never used  Substance Use Topics  . Alcohol use: No  . Drug use: No    ROS: As per history of present illness, otherwise negative  BP 126/72   Pulse 70   Ht 6' (1.829 m)   Wt 205 lb (93 kg)   SpO2 97%   BMI 27.80 kg/m  Constitutional: Well-developed and well-nourished. No distress. HEENT: Normocephalic and atraumatic. No scleral icterus. Neck: Neck supple. Trachea midline. Cardiovascular: Normal rate, regular rhythm and intact distal pulses. No M/R/G Pulmonary/chest: Effort normal and breath sounds normal. No wheezing, rales or rhonchi. Abdominal: Soft, nontender, nondistended. Bowel sounds active throughout. There are no masses palpable. No hepatosplenomegaly. Extremities: no clubbing, cyanosis, or edema Neurological: Alert and oriented to person place and time. Skin: Skin is warm and dry.  Psychiatric: Normal mood and affect. Behavior is normal.  RELEVANT LABS AND IMAGING: CBC    Component Value Date/Time   WBC 4.5 08/07/2009 1023   RBC 4.82 08/07/2009 1023   HGB 14.6 08/07/2009 1023   HCT 44.7 08/07/2009 1023   PLT 194.0 08/07/2009 1023   MCV 92.8 08/07/2009 1023   MCHC 32.7 08/07/2009 1023   RDW 13.0 08/07/2009 1023   LYMPHSABS 1.1 08/07/2009 1023   MONOABS 0.5 08/07/2009 1023   EOSABS 0.1 08/07/2009 1023   BASOSABS 0.0 08/07/2009 1023    CMP     Component Value Date/Time   NA 141 08/07/2009 1023   K 4.1 08/07/2009 1023   CL 106 08/07/2009 1023   CO2 29 08/07/2009 1023   GLUCOSE 94 08/07/2009 1023   BUN 14 08/07/2009 1023   CREATININE 1.0 08/07/2009 1023   CALCIUM 9.9 08/07/2009 1023   PROT 7.2 08/07/2009 1023   ALBUMIN 3.9 08/07/2009 1023   AST 24 08/07/2009 1023   ALT 31 08/07/2009  1023   ALKPHOS 77 08/07/2009 1023   BILITOT 0.8 08/07/2009 1023   GFRNONAA 80.84 08/07/2009 1023   GFRAA  04/05/2007 0805    >60        The eGFR has been calculated using the MDRD equation. This calculation has not been validated in all clinical    ASSESSMENT/PLAN: 69 year old male with a past medical history of GERD, esophageal web, prostate cancer status post robotic prostatectomy, history of arrhythmia status post ablation, diverticulosis and COVID-19 in January 2021 who is seen to discuss  GERD, epigastric pain and bloating as well as dysphagia.   1. GERD/epigastric discomfort/esophageal dysphagia --he did have an esophageal web, Dr. Sharlett Iles 10 years ago and this likely explains his dysphagia.  Pantoprazole has been helpful for some of the epigastric discomfort but not entirely.  I recommended upper endoscopy to further evaluate and also for probable dilation. --Continue pantoprazole 40 mg daily --Upper endoscopy in the [place] with probable dilation; scheduled for 06/25/2020  2.  Abdominal bloating --he has some early satiety symptoms as well.  We discussed the gut microbiota today and there may have been a disruption of his microbiota caused by COVID-19.  I am going to check him for SIBO. --Lactulose breath testing  3.  CRC screening --he is up-to-date with colonoscopy last in 2019      TL:XBWIO, Orlando Penner, [facility] [address] [place] [address]

## 2020-05-11 NOTE — Patient Instructions (Addendum)
You have been scheduled for an endoscopy. Please follow written instructions given to you at your visit today. If you use inhalers (even only as needed), please bring them with you on the day of your procedure.  Continue pantoprazole 40 mg daily.  You have been given a testing kit to check for small intestine bacterial overgrowth (SIBO) which is completed by a company named [facility]. Make sure to return your test in the mail using the return mailing label given to you along with the kit. Your demographic and insurance information have already been sent to the company and they should be in contact with you over the next week regarding this test. [facility] will collect an upfront charge of $99.74 for commercial insurance plans and $209.74 is you are paying cash. Make sure to discuss with [facility] PRIOR to having the test if they have gotten informatoin from your insurance company as to how much your testing will cost out of pocket, if any. Please keep in mind that you will be getting a call from phone number [phone] or a similar number. If you do not hear from them within this time frame, please call our office at [phone].    If you are age 51 or older, your body mass index should be between 23-30. Your Body mass index is 27.8 kg/m. If this is out of the aforementioned range listed, please consider follow up with your Primary Care Provider.  Due to recent changes in healthcare laws, you may see the results of your imaging and laboratory studies on MyChart before your provider has had a chance to review them.  We understand that in some cases there may be results that are confusing or concerning to you. Not all laboratory results come back in the same time frame and the provider may be waiting for multiple results in order to interpret others.  Please give [place] 48 hours in order for your provider to thoroughly review all the results before contacting the office for clarification  of your results.

## 2020-05-11 NOTE — Patient Instructions (Signed)
We will schedule you for high-resolution CT for evaluation of dyspnea, interstitial lung disease post Covid Schedule pulmonary function test Follow-up in 1 to 2 months after these tests for review

## 2020-05-11 NOTE — Progress Notes (Signed)
Derek Cervantes    [id number]    1950/12/28  Primary Care Physician:Pryor, Orlando Penner, MD  Referring Physician: [name], MD [facility] [place] [address]  Chief complaint: Consult for dyspnea post COVID-25  HPI: 69 year old with history of PVCs, prostate cancer, allergies, postnasal drip, chronic cough Diagnosed with COVID-19 in January 2021.  He did not require hospitalizations.  Symptoms are mild and he recovered in a few weeks Complains of episodic dyspnea in the evenings since COVID-19.  Denies any wheezing, sputum production Feels that this may be stress related.  History notable for prostatectomy in 2010.  He developed mild dyspnea postprocedure with CT no abnormalities at that time. History of SVT status post ablation x2.  Follows with Dr. Caryl Comes He has chronic cough with postnasal drip and GERD for which he takes Protonix and Carafate   Pets: No pets Occupation: Dentist Exposures: Had mold in the basement around 2018.  This was remediated.  No ongoing exposures.  No hot tub, Jacuzzi.  No feather pillows or comforters Smoking history: Never smoker Travel history: No significant travel history.  Lives in [place] Relevant family history: No significant family history of lung disease  Outpatient Encounter Medications as of 05/11/2020  Medication Sig  . ascorbic acid (VITAMIN C) 500 MG tablet Take 500 mg by mouth daily.    . metoprolol tartrate (LOPRESSOR) 25 MG tablet Take 0.5 tablets (12.5 mg total) by mouth daily as needed (palpitations or increased heart rate).  . Multiple Vitamins-Minerals (ZINC PO) Take 1 tablet by mouth daily.  Marland Kitchen NITROSTAT 0.4 MG SL tablet Place 0.4 mg under the tongue every 5 (five) minutes as needed for chest pain. Take as needed  . pantoprazole (PROTONIX) 40 MG tablet Take 40 mg by mouth daily.  . sucralfate (CARAFATE) 1 g tablet Take by mouth as needed.  Marland Kitchen VITAMIN D PO Take 1 tablet by mouth daily.  . Zinc Methionate 50 MG  CAPS Take 1 capsule by mouth daily.  . [DISCONTINUED] azithromycin (ZITHROMAX) 250 MG tablet Take 250 mg by mouth daily.   No facility-administered encounter medications on file as of 05/11/2020.    Allergies as of 05/11/2020 - Review Complete 05/11/2020  Allergen Reaction Noted  . Flecainide acetate    . Propafenone hcl    . Sotalol hcl      Past Medical History:  Diagnosis Date  . Cardiomyopathy    PVC related  . COVID-19 08/2019  . Diverticulosis   . Dyspnea    recumbent  . ED (erectile dysfunction)   . Limb tremor    hand tremors unassociated with palpitation  . Osteoarthritis   . Polio    as a child  . PPD positive 1970  . Prostate cancer ([place]) 2010   s/p robotic surgery - stage 3  . Ventricular tachycardia [facility])    s/p ablation at the [place] [facility]    Past Surgical History:  Procedure Laterality Date  . CARDIAC ELECTROPHYSIOLOGY STUDY AND ABLATION     x 2  . CARPAL TUNNEL RELEASE Right 2021  . CATARACT EXTRACTION, BILATERAL    . HIP ARTHROPLASTY Right   . ROBOT ASSISTED LAPAROSCOPIC RADICAL PROSTATECTOMY    . TONSILLECTOMY    . TOTAL HIP ARTHROPLASTY Left     Family History  Problem Relation Age of Onset  . Coronary artery disease Maternal Grandmother   . Bladder Cancer Father   . Colon cancer Neg Hx  Social History   Socioeconomic History  . Marital status: Married    Spouse name: Not on file  . Number of children: Not on file  . Years of education: Not on file  . Highest education level: Not on file  Occupational History    Employer: SELF-EMPLOYED    Comment: [employer]  Tobacco Use  . Smoking status: Never Smoker  . Smokeless tobacco: Never Used  Substance and Sexual Activity  . Alcohol use: No  . Drug use: No  . Sexual activity: Not on file  Other Topics Concern  . Not on file  Social History Narrative  . Not on file   Social Determinants of Health   Financial Resource Strain:   . Difficulty of Paying Living Expenses: Not on  file  Food Insecurity:   . Worried About Charity fundraiser in the Last Year: Not on file  . Ran Out of Food in the Last Year: Not on file  Transportation Needs:   . Lack of Transportation (Medical): Not on file  . Lack of Transportation (Non-Medical): Not on file  Physical Activity:   . Days of Exercise per Week: Not on file  . Minutes of Exercise per Session: Not on file  Stress:   . Feeling of Stress : Not on file  Social Connections:   . Frequency of Communication with Friends and Family: Not on file  . Frequency of Social Gatherings with Friends and Family: Not on file  . Attends Religious Services: Not on file  . Active Member of Clubs or Organizations: Not on file  . Attends Archivist Meetings: Not on file  . Marital Status: Not on file  Intimate Partner Violence:   . Fear of Current or Ex-Partner: Not on file  . Emotionally Abused: Not on file  . Physically Abused: Not on file  . Sexually Abused: Not on file    Review of systems: Review of Systems  Constitutional: Negative for fever and chills.  HENT: Negative.   Eyes: Negative for blurred vision.  Respiratory: as per HPI  Cardiovascular: Negative for chest pain and palpitations.  Gastrointestinal: Negative for vomiting, diarrhea, blood per rectum. Genitourinary: Negative for dysuria, urgency, frequency and hematuria.  Musculoskeletal: Negative for myalgias, back pain and joint pain.  Skin: Negative for itching and rash.  Neurological: Negative for dizziness, tremors, focal weakness, seizures and loss of consciousness.  Endo/Heme/Allergies: Negative for environmental allergies.  Psychiatric/Behavioral: Negative for depression, suicidal ideas and hallucinations.  All other systems reviewed and are negative.  Physical Exam: Blood pressure 130/80, pulse 61, temperature 98.3 F (36.8 C), temperature source Temporal, height 6\' 1"  (1.854 m), weight 205 lb (93 kg), SpO2 99 %. Gen:      No acute  distress HEENT:  EOMI, sclera anicteric Neck:     No masses; no thyromegaly Lungs:    Clear to auscultation bilaterally; normal respiratory effort CV:         Regular rate and rhythm; no murmurs Abd:      + bowel sounds; soft, non-tender; no palpable masses, no distension Ext:    No edema; adequate peripheral perfusion Skin:      Warm and dry; no rash Neuro: alert and oriented x 3 Psych: normal mood and affect  Data Reviewed: Imaging: Chest x-ray 04/17/2020-minimal linear atelectasis at the left base.  PFTs:  Labs:  Assessment:  Post COVID-19 Has mild symptoms of dyspnea Chest x-ray primary care with report of linear atelectasis  Given persistent symptoms  we will evaluate with high-res CT and PFTs for better evaluation of the lung  Chronic cough Secondary to upper airway cough, GERD Continue Protonix, Carafate  Plan/Recommendations: High-res CT, PFTs  Marshell Garfinkel MD [facility] 05/11/2020, 9:35 AM  CC: [name], MD

## 2020-05-13 ENCOUNTER — Institutional Professional Consult (permissible substitution): Payer: Medicare Other | Admitting: Internal Medicine

## 2020-05-25 ENCOUNTER — Telehealth: Payer: Self-pay | Admitting: Internal Medicine

## 2020-05-25 NOTE — Telephone Encounter (Signed)
Dominica Severin from [facility] is requesting a call back from a nurse to discuss a recent SIBO test result.    [phone]

## 2020-05-25 NOTE — Telephone Encounter (Signed)
Derek Cervantes with [facility] if faxing over the results on this pt. States the hydrogen was elevated throughout the test and the result of test read negative. If the provider has questions they can call back and speak with him.

## 2020-05-26 NOTE — Telephone Encounter (Signed)
As we discussed, please check with rep I have results which are negative, but if there is ? Regarding results then I would repeat it JMP

## 2020-05-26 NOTE — Telephone Encounter (Signed)
Spoke with Dominica Severin, the owner of the lab that does the SIBO testing regarding pts test. States that pts baseline hydrogen was elevated throughout the entire test from the beginning and stayed elevated throughout. States since the pt had clinical signs of SIBO with bloating etc he feels this pt would respond to tx, states there was gas present throughout the colon. He would only repeat the test if the pt did not prep correctly but he thinks the pt did because the breath test was done correctly. If there are further questions that is his cell phone and he is happy to answer any further questions.

## 2020-05-26 NOTE — Telephone Encounter (Signed)
Left message for Obryan to call back.

## 2020-05-28 ENCOUNTER — Other Ambulatory Visit: Payer: Self-pay

## 2020-05-28 MED ORDER — NEOMYCIN SULFATE 500 MG PO TABS: 500.0000 mg | ORAL_TABLET | Freq: Two times a day (BID) | ORAL | 0 refills | Status: DC

## 2020-05-28 MED ORDER — AMOXICILLIN-POT CLAVULANATE 875-125 MG PO TABS: 1.0000 | ORAL_TABLET | Freq: Two times a day (BID) | ORAL | 0 refills | Status: DC

## 2020-05-28 NOTE — Telephone Encounter (Signed)
Please let Dr. Orvil Feil know that his lactulose breath testing revealed elevated hydrogen gas throughout the testing period. While technically there was no change in hydrogen, given that it was elevated from the beginning I have a high suspicion for SIBO given his symptoms. I would recommend we treat for SIBO with Augmentin 875 mg twice daily and neomycin 500 mg twice daily x 10 days He can proceed with upper endoscopy as scheduled Please let me know if he has any questions

## 2020-05-28 NOTE — Telephone Encounter (Signed)
Spoke with pt and he is aware. He knows to keep his appt as scheduled and to pick up the antibiotics.

## 2020-05-28 NOTE — Telephone Encounter (Signed)
Left message for pt to call back. 05/28/20. Scripts sent to pharmacy.

## 2020-06-19 ENCOUNTER — Inpatient Hospital Stay: Admission: RE | Admit: 2020-06-19 | Payer: Medicare Other | Source: Ambulatory Visit

## 2020-06-19 ENCOUNTER — Ambulatory Visit
Admission: RE | Admit: 2020-06-19 | Discharge: 2020-06-19 | Disposition: A | Discharge status: Home / Self Care | Payer: Medicare Other | Source: Ambulatory Visit | Attending: Pulmonary Disease | Admitting: Pulmonary Disease

## 2020-06-19 ENCOUNTER — Other Ambulatory Visit: Payer: Self-pay

## 2020-06-19 ENCOUNTER — Encounter: Payer: Self-pay | Admitting: Internal Medicine

## 2020-06-19 ENCOUNTER — Ambulatory Visit (INDEPENDENT_AMBULATORY_CARE_PROVIDER_SITE_OTHER): Payer: Medicare Other | Admitting: Internal Medicine

## 2020-06-19 VITALS — BP 104/68 | HR 70 | Ht 72.0 in | Wt 205.0 lb

## 2020-06-19 DIAGNOSIS — I493 Ventricular premature depolarization: Secondary | ICD-10-CM | POA: Diagnosis not present

## 2020-06-19 DIAGNOSIS — I452 Bifascicular block: Secondary | ICD-10-CM | POA: Diagnosis not present

## 2020-06-19 DIAGNOSIS — R0600 Dyspnea, unspecified: Secondary | ICD-10-CM

## 2020-06-19 NOTE — Patient Instructions (Signed)

## 2020-06-19 NOTE — Progress Notes (Signed)
Patient Care Team: [name], MD as PCP - General (Internal Medicine) Deboraha Sprang, MD as PCP - Electrophysiology (Cardiology)   HPI  Derek Cervantes is a 69 y.o. male seen in followup for PVCs for which he underwent ablation at [facility] with Jerold Coombe   Has intercurrently done exceptionally well except for breakthrough 9/21 in the context of some GI illness.  Seen by [facility] with interval resolution   He had a normal  Myoview scan 2010  Since the episode in September PVCs have been quiet.  Quite anxious about it.  Also, her daughter has brain cancer and their efforts to sell the practice continue. 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   COVID-19 08/2019   Diverticulosis    Dyspnea    recumbent   ED (erectile dysfunction)    Limb tremor    hand tremors unassociated with palpitation   Osteoarthritis    Polio    as a child   PPD positive 1970   Prostate cancer ([place]) 2010   s/p robotic surgery - stage 3   Ventricular tachycardia ([place])    s/p ablation at the [place] [facility]    Past Surgical History:  Procedure Laterality Date   CARDIAC ELECTROPHYSIOLOGY STUDY AND ABLATION     x 2   CARPAL TUNNEL RELEASE Right 2021   CATARACT EXTRACTION, BILATERAL     HIP ARTHROPLASTY Right    ROBOT ASSISTED LAPAROSCOPIC RADICAL PROSTATECTOMY     TONSILLECTOMY     TOTAL HIP ARTHROPLASTY Left     Current Outpatient Medications  Medication Sig Dispense Refill   amoxicillin-clavulanate (AUGMENTIN) 875-125 MG tablet Take 1 tablet by mouth 2 (two) times daily. 20 tablet 0   ascorbic acid (VITAMIN C) 500 MG tablet Take 500 mg by mouth daily.       metoprolol tartrate (LOPRESSOR) 25 MG tablet Take 0.5 tablets (12.5 mg total) by mouth daily as needed (palpitations or increased heart rate). 30 tablet 1   Multiple Vitamins-Minerals (ZINC PO) Take 1 tablet by mouth daily.     neomycin (MYCIFRADIN) 500 MG tablet Take 1  tablet (500 mg total) by mouth in the morning and at bedtime. 20 tablet 0   NITROSTAT 0.4 MG SL tablet Place 0.4 mg under the tongue every 5 (five) minutes as needed for chest pain. Take as needed     pantoprazole (PROTONIX) 40 MG tablet Take 40 mg by mouth daily.     sucralfate (CARAFATE) 1 g tablet Take by mouth as needed.     VITAMIN D PO Take 1 tablet by mouth daily.     Zinc Methionate 50 MG CAPS Take 1 capsule by mouth daily.     No current facility-administered medications for this visit.    Allergies  Allergen Reactions   Flecainide Acetate     REACTION: intolerance   Propafenone Hcl     REACTION: intolerance   Sotalol Hcl     REACTION: intolerance    Review of Systems negative except from HPI and PMH  Physical Exam BP 104/68    Pulse 70    Ht 6' (1.829 m)    Wt 205 lb (93 kg)    SpO2 96%    BMI 27.80 kg/m  Well developed and well nourished in no acute distress HENT normal Neck supple with JVP-flat Clear Regular rate and rhythm, no  murmur Abd-soft with active BS No Clubbing cyanosis   edema Skin-warm  and dry A & Oriented  Grossly normal sensory and motor function  ECG sinus at 70 Intervals 14/14/43 Axis I 80 Right bundle branch block compared to 10/20 unchanged  Assessment and  Plan  PVC history of ablation at the [facility] clinic  Right bundle branch/left posterior fascicular block   PVCs again are quiescient.  He has metoprolol to take as needed.

## 2020-06-23 ENCOUNTER — Telehealth: Payer: Self-pay | Admitting: Internal Medicine

## 2020-06-23 NOTE — Telephone Encounter (Signed)
Ok, please ensure the results are faxed to [phone].  Thanks [employer]

## 2020-06-23 NOTE — Telephone Encounter (Signed)
Patient called Derek Cervantes at [facility] path to [place] appointment there today. He states that he will get his covid testing done in [place] & will has results faxed to our office.

## 2020-06-24 NOTE — Telephone Encounter (Signed)
I spoke with wife- she states pt went to get his covid test but is contacting [facility] to make sure we get the results.  I asked her to call me back with [facility]'s number so I can call to get a result.  She states she will call me back

## 2020-06-24 NOTE — Telephone Encounter (Signed)
Patient called stating he faxed covid results today to [phone].

## 2020-06-24 NOTE — Telephone Encounter (Signed)
Fax received from [facility] at 2:44 pm today, 06/24/20. Sars-CoV-2-NAA collected 06/21/20 was NOT detected. Test completed at [facility], [place]

## 2020-06-25 ENCOUNTER — Other Ambulatory Visit: Payer: Self-pay

## 2020-06-25 ENCOUNTER — Ambulatory Visit (AMBULATORY_SURGERY_CENTER): Payer: Medicare Other | Admitting: Internal Medicine

## 2020-06-25 ENCOUNTER — Encounter: Payer: Self-pay | Admitting: Internal Medicine

## 2020-06-25 VITALS — BP 123/88 | HR 68 | Temp 98.6°F | Resp 21 | Ht 72.0 in | Wt 205.0 lb

## 2020-06-25 DIAGNOSIS — R1319 Other dysphagia: Secondary | ICD-10-CM

## 2020-06-25 DIAGNOSIS — R1013 Epigastric pain: Secondary | ICD-10-CM

## 2020-06-25 DIAGNOSIS — K219 Gastro-esophageal reflux disease without esophagitis: Secondary | ICD-10-CM | POA: Diagnosis not present

## 2020-06-25 MED ORDER — SODIUM CHLORIDE 0.9 % IV SOLN: 500.0000 mL | Freq: Once | INTRAVENOUS | Status: DC

## 2020-06-25 NOTE — Op Note (Signed)
[facility] Patient Name: Derek Cervantes Procedure Date: 06/25/2020 4:01 PM MRN: [id number] Endoscopist: Jerene Bears , MD Age: 69 Referring MD:  Date of Birth: May 24, 1951 Gender: Male Account #: [id number] Procedure:                Upper GI endoscopy Indications:              Epigastric abdominal pain, Dysphagia, Suspected                            gastro-esophageal reflux disease, SIBO breath                            testing positive s/p recent treatment with                            Augmentin + neomycin Medicines:                Monitored Anesthesia Care Procedure:                Pre-Anesthesia Assessment:                           - Prior to the procedure, a History and Physical                            was performed, and patient medications and                            allergies were reviewed. The patient's tolerance of                            previous anesthesia was also reviewed. The risks                            and benefits of the procedure and the sedation                            options and risks were discussed with the patient.                            All questions were answered, and informed consent                            was obtained. Prior Anticoagulants: The patient has                            taken no previous anticoagulant or antiplatelet                            agents. ASA Grade Assessment: III - A patient with                            severe systemic disease. After reviewing the risks  and benefits, the patient was deemed in                            satisfactory condition to undergo the procedure.                           After obtaining informed consent, the endoscope was                            passed under direct vision. Throughout the                            procedure, the patient's blood pressure, pulse, and                            oxygen saturations were monitored continuously. The                             Endoscope was introduced through the mouth, and                            advanced to the second part of duodenum. The upper                            GI endoscopy was accomplished without difficulty.                            The patient tolerated the procedure well. Scope In: Scope Out: Findings:                 Normal mucosa was found in the entire esophagus. No                            evidence of esophagitis. Z-line is regular at 44 cm.                           No endoscopic abnormality was evident in the                            esophagus to explain the patient's complaint of                            dysphagia. It was decided, however, to proceed with                            dilation of the middle third of the esophagus, of                            the lower third of the esophagus and at the                            gastroesophageal junction. A TTS dilator was passed  through the scope. Dilation with a 16-17-18 mm                            balloon dilator was performed to 18 mm.                           The cardia and gastric fundus were normal on                            retroflexion.                           The entire examined stomach was normal.                           The examined duodenum was normal. Complications:            No immediate complications. Estimated Blood Loss:     Estimated blood loss: none. Impression:               - Normal mucosa was found in the entire esophagus.                           - No endoscopic esophageal abnormality to explain                            dysphagia. Empiric dilation to 18 mm.                           - Normal stomach.                           - Normal examined duodenum.                           - No specimens collected. Recommendation:           - Patient has a contact number available for                            emergencies. The signs and symptoms of  potential                            delayed complications were discussed with the                            patient. Return to normal activities tomorrow.                            Written discharge instructions were provided to the                            patient.                           - Resume previous diet.                           -  Continue present medications.                           - If dysphagia symptom (trouble with swallowing)                            persists please contact my office. Next step would                            be to evaluate esophageal motility. Jerene Bears, MD 06/25/2020 4:24:46 PM This report has been signed electronically.

## 2020-06-25 NOTE — Patient Instructions (Signed)
Please read handouts provided. Continue present medications. If dysphagia symptoms ( trouble with swallowing ) persists please contact Dr. Vena Rua office.     YOU HAD AN ENDOSCOPIC PROCEDURE TODAY AT [facility]:   Refer to the procedure report that was given to you for any specific questions about what was found during the examination.  If the procedure report does not answer your questions, please call your gastroenterologist to clarify.  If you requested that your care partner not be given the details of your procedure findings, then the procedure report has been included in a sealed envelope for you to review at your convenience later.  YOU SHOULD EXPECT: Some feelings of bloating in the abdomen. Passage of more gas than usual.  Walking can help get rid of the air that was put into your GI tract during the procedure and reduce the bloating. If you had a lower endoscopy (such as a colonoscopy or flexible sigmoidoscopy) you may notice spotting of blood in your stool or on the toilet paper. If you underwent a bowel prep for your procedure, you may not have a normal bowel movement for a few days.  Please Note:  You might notice some irritation and congestion in your nose or some drainage.  This is from the oxygen used during your procedure.  There is no need for concern and it should clear up in a day or so.  SYMPTOMS TO REPORT IMMEDIATELY:    Following upper endoscopy (EGD)  Vomiting of blood or coffee ground material  New chest pain or pain under the shoulder blades  Painful or persistently difficult swallowing  New shortness of breath  Fever of 100F or higher  Black, tarry-looking stools  For urgent or emergent issues, a gastroenterologist can be reached at any hour by calling [phone]. Do not use MyChart messaging for urgent concerns.    DIET:  We do recommend a small meal at first, but then you may proceed to your regular diet.  Drink plenty of fluids but  you should avoid alcoholic beverages for 24 hours.  ACTIVITY:  You should plan to take it easy for the rest of today and you should NOT DRIVE or use heavy machinery until tomorrow (because of the sedation medicines used during the test).    FOLLOW UP: Our staff will call the number listed on your records 48-72 hours following your procedure to check on you and address any questions or concerns that you may have regarding the information given to you following your procedure. If we do not reach you, we will leave a message.  We will attempt to reach you two times.  During this call, we will ask if you have developed any symptoms of COVID 19. If you develop any symptoms (ie: fever, flu-like symptoms, shortness of breath, cough etc.) before then, please call [phone].  If you test positive for Covid 19 in the 2 weeks post procedure, please call and report this information to [place].    If any biopsies were taken you will be contacted by phone or by letter within the next 1-3 weeks.  Please call us at [phone] if you have not heard about the biopsies in 3 weeks.    SIGNATURES/CONFIDENTIALITY: You and/or your care partner have signed paperwork which will be entered into your electronic medical record.  These signatures attest to the fact that that the information above on your After Visit Summary has been reviewed and is understood.  Full responsibility of  the confidentiality of this discharge information lies with you and/or your care-partner. 

## 2020-06-25 NOTE — Progress Notes (Signed)
Pt's states no medical or surgical changes since previsit or office visit. 

## 2020-06-25 NOTE — Progress Notes (Signed)
1604 Robinul 0.1 mg IV given due large amount of secretions upon assessment.  MD made aware, vss 

## 2020-06-25 NOTE — Progress Notes (Signed)
Report given to PACU, vss 

## 2020-06-25 NOTE — Progress Notes (Signed)
C.W. vital signs. 

## 2020-06-25 NOTE — Progress Notes (Signed)
Called to room to assist during endoscopic procedure.  Patient ID and intended procedure confirmed with present staff. Received instructions for my participation in the procedure from the performing physician.  

## 2020-06-26 ENCOUNTER — Encounter: Payer: Self-pay | Admitting: Pulmonary Disease

## 2020-06-26 ENCOUNTER — Ambulatory Visit (INDEPENDENT_AMBULATORY_CARE_PROVIDER_SITE_OTHER): Payer: Medicare Other | Admitting: Pulmonary Disease

## 2020-06-26 VITALS — BP 118/70 | HR 73 | Temp 97.8°F | Ht 71.5 in | Wt 207.0 lb

## 2020-06-26 DIAGNOSIS — R0600 Dyspnea, unspecified: Secondary | ICD-10-CM | POA: Diagnosis not present

## 2020-06-26 LAB — PULMONARY FUNCTION TEST
DL/VA % pred: 108 %
DL/VA: 4.35 ml/min/mmHg/L
DLCO cor % pred: 86 %
DLCO cor: 23.35 ml/min/mmHg
DLCO unc % pred: 86 %
DLCO unc: 23.35 ml/min/mmHg
FEF 25-75 Post: 3.67 L/sec
FEF 25-75 Pre: 2.79 L/sec
FEF2575-%Change-Post: 31 %
FEF2575-%Pred-Post: 142 %
FEF2575-%Pred-Pre: 108 %
FEV1-%Change-Post: 4 %
FEV1-%Pred-Post: 86 %
FEV1-%Pred-Pre: 83 %
FEV1-Post: 2.97 L
FEV1-Pre: 2.83 L
FEV1FVC-%Change-Post: 1 %
FEV1FVC-%Pred-Pre: 109 %
FEV6-%Change-Post: 3 %
FEV6-%Pred-Post: 83 %
FEV6-%Pred-Pre: 80 %
FEV6-Post: 3.65 L
FEV6-Pre: 3.52 L
FEV6FVC-%Change-Post: 0 %
FEV6FVC-%Pred-Post: 105 %
FEV6FVC-%Pred-Pre: 105 %
FVC-%Change-Post: 3 %
FVC-%Pred-Post: 78 %
FVC-%Pred-Pre: 76 %
FVC-Post: 3.65 L
FVC-Pre: 3.53 L
Post FEV1/FVC ratio: 81 %
Post FEV6/FVC ratio: 100 %
Pre FEV1/FVC ratio: 80 %
Pre FEV6/FVC Ratio: 100 %
RV % pred: 85 %
RV: 2.17 L
TLC % pred: 76 %
TLC: 5.61 L

## 2020-06-26 NOTE — Patient Instructions (Signed)
Glad you are doing well with regard to breathing His CT scan and PFTs show very minimal to mild changes with nonspecific scarring) area of osteophyte.  I am not too concerned about this and we can recheck a high-resolution CT in 1 year Start aerobic exercise program at home.  Please discuss with your primary care about aortic atherosclerosis  Follow-up in 1 year after CT scan

## 2020-06-26 NOTE — Progress Notes (Signed)
Derek Cervantes    [id number]    1951-06-19  Primary Care Physician:Pryor, Orlando Penner, MD  Referring Physician: [name], MD [facility] [place] [address]  Chief complaint: Consult for dyspnea post COVID-22  HPI: 69 year old with history of PVCs, prostate cancer, allergies, postnasal drip, chronic cough Diagnosed with COVID-19 in January 2021.  He did not require hospitalizations.  Symptoms are mild and he recovered in a few weeks Complains of episodic dyspnea in the evenings since COVID-19.  Denies any wheezing, sputum production Feels that this may be stress related.  History notable for prostatectomy in 2010.  He developed mild dyspnea postprocedure with CT no abnormalities at that time. History of SVT status post ablation x2.  Follows with Dr. Caryl Comes He has chronic cough with postnasal drip and GERD for which he takes Protonix and Carafate   Pets: No pets Occupation: Dentist Exposures: Had mold in the basement around 2018.  This was remediated.  No ongoing exposures.  No hot tub, Jacuzzi.  No feather pillows or comforters Smoking history: Never smoker Travel history: No significant travel history.  Lives in [place] Relevant family history: No significant family history of lung disease  Interval history: Here for review of CT and PFTs.  States that breathing is doing well with no issues  Outpatient Encounter Medications as of 06/26/2020  Medication Sig  . ascorbic acid (VITAMIN C) 500 MG tablet Take 500 mg by mouth daily.  . metoprolol tartrate (LOPRESSOR) 25 MG tablet Take 0.5 tablets (12.5 mg total) by mouth daily as needed (palpitations or increased heart rate).  . Multiple Vitamins-Minerals (ZINC PO) Take 1 tablet by mouth daily.  Marland Kitchen NITROSTAT 0.4 MG SL tablet Place 0.4 mg under the tongue every 5 (five) minutes as needed for chest pain. Take as needed  . sucralfate (CARAFATE) 1 g tablet Take by mouth as needed.  Marland Kitchen VITAMIN D PO Take 1 tablet by  mouth daily.  . Zinc Methionate 50 MG CAPS Take 1 capsule by mouth daily.   No facility-administered encounter medications on file as of 06/26/2020.    Allergies as of 06/26/2020 - Review Complete 06/26/2020  Allergen Reaction Noted  . Flecainide acetate    . Propafenone hcl    . Sotalol hcl      Past Medical History:  Diagnosis Date  . 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 COVID-19 08/2019  . Diverticulosis   . Dyspnea    recumbent  . ED (erectile dysfunction)   . Limb tremor    hand tremors unassociated with palpitation  . Osteoarthritis   . Polio    as a child  . PPD positive 1970  . Prostate cancer ([place]) 2010   s/p robotic surgery - stage 3  . Ventricular tachycardia [facility])    s/p ablation at the [place] clinic    Past Surgical History:  Procedure Laterality Date  . CARDIAC ELECTROPHYSIOLOGY STUDY AND ABLATION     x 2  . CARPAL TUNNEL RELEASE Right 2021  . CATARACT EXTRACTION, BILATERAL    . HIP ARTHROPLASTY Right   . ROBOT ASSISTED LAPAROSCOPIC RADICAL PROSTATECTOMY    . TONSILLECTOMY    . TOTAL HIP ARTHROPLASTY Left     Family History  Problem Relation Age of Onset  . Coronary artery disease Maternal Grandmother   . Bladder Cancer Father   . Colon cancer Neg Hx   . Esophageal cancer Neg Hx   .  Stomach cancer Neg Hx     Social History   Socioeconomic History  . Marital status: Married    Spouse name: Not on file  . Number of children: Not on file  . Years of education: Not on file  . Highest education level: Not on file  Occupational History    Employer: SELF-EMPLOYED    Comment: denist  Tobacco Use  . Smoking status: Never Smoker  . Smokeless tobacco: Never Used  Vaping Use  . Vaping Use: Never used  Substance and Sexual Activity  . Alcohol use: No  . Drug use: No  . Sexual activity: Not on file  Other Topics Concern  . Not on file  Social History Narrative  . Not on file   Social Determinants of Health   Financial Resource  Strain: Not on file  Food Insecurity: Not on file  Transportation Needs: Not on file  Physical Activity: Not on file  Stress: Not on file  Social Connections: Not on file  Intimate Partner Violence: Not on file    Review of systems: Review of Systems  Constitutional: Negative for fever and chills.  HENT: Negative.   Eyes: Negative for blurred vision.  Respiratory: as per HPI  Cardiovascular: Negative for chest pain and palpitations.  Gastrointestinal: Negative for vomiting, diarrhea, blood per rectum. Genitourinary: Negative for dysuria, urgency, frequency and hematuria.  Musculoskeletal: Negative for myalgias, back pain and joint pain.  Skin: Negative for itching and rash.  Neurological: Negative for dizziness, tremors, focal weakness, seizures and loss of consciousness.  Endo/Heme/Allergies: Negative for environmental allergies.  Psychiatric/Behavioral: Negative for depression, suicidal ideas and hallucinations.  All other systems reviewed and are negative.  Physical Exam: Blood pressure 130/80, pulse 61, temperature 98.3 F (36.8 C), temperature source Temporal, height 6\' 1"  (1.854 m), weight 205 lb (93 kg), SpO2 99 %. Gen:      No acute distress HEENT:  EOMI, sclera anicteric Neck:     No masses; no thyromegaly Lungs:    Clear to auscultation bilaterally; normal respiratory effort CV:         Regular rate and rhythm; no murmurs Abd:      + bowel sounds; soft, non-tender; no palpable masses, no distension Ext:    No edema; adequate peripheral perfusion Skin:      Warm and dry; no rash Neuro: alert and oriented x 3 Psych: normal mood and affect  Data Reviewed: Imaging: Chest x-ray 04/17/2020-minimal linear atelectasis at the left base.  High-res CT 06/19/2020-mild patchy groundglass and septal thickening at the lung base, minimal scarring secondary to osteophyte in the right lower lobe.  Aortic atherosclerosis. I have reviewed the images  personally.  PFTs: 06/26/2020 FVC 3.65 [90%], FEV1 2.97 [86%], F/F 81, TLC 5.61 [76%], DLCO 23.35 [86%] Mild restriction  Labs:  Assessment:  Post COVID-19 Has mild symptoms of dyspnea CT reviewed with minimal nonspecific areas of groundglass attenuation and scarring around vertebral osteophyte.  This corresponds to mild restriction.  Reviewed results with patient and reassured him that no treatment is needed at present. We will continue to monitor this with a high-res CT in 1 year Advised starting an aerobic exercise program.  Chronic cough Secondary to upper airway cough, GERD Continue Protonix, Carafate Follows with [facility]  Plan/Recommendations: High-res CT in 1 year.  Marshell Garfinkel MD [facility] 06/26/2020, 10:26 AM  CC: [name], MD

## 2020-06-26 NOTE — Progress Notes (Signed)
PFT done today. 

## 2020-06-29 ENCOUNTER — Telehealth: Payer: Self-pay | Admitting: *Deleted

## 2020-06-29 ENCOUNTER — Telehealth: Payer: Self-pay

## 2020-06-29 NOTE — Telephone Encounter (Signed)
1. Have you developed a fever since your procedure? no  2.   Have you had an respiratory symptoms (SOB or cough) since your procedure? *no  3.   Have you tested positive for COVID 19 since your procedure   4.   Have you had any family members/close contacts diagnosed with the COVID 19 since your procedure?  no   If yes to any of these questions please route to Joylene John, RN and Joella Prince, RN Follow up Call-  Call back number 06/25/2020  Post procedure Call Back phone  # [phone] cell ok to speak  Permission to leave phone message Yes  Some recent data might be hidden     Patient questions:  Do you have a fever, pain , or abdominal swelling? No. Pain Score  0 *  Have you tolerated food without any problems? Yes.    Have you been able to return to your normal activities? Yes.    Do you have any questions about your discharge instructions: Diet   No. Medications  No. Follow up visit  No.  Do you have questions or concerns about your Care? No.  Actions: * If pain score is 4 or above: No action needed, pain <4.

## 2020-06-29 NOTE — Telephone Encounter (Signed)
  Follow up Call-  Call back number 06/25/2020  Post procedure Call Back phone  # [phone] cell ok to speak  Permission to leave phone message Yes  Some recent data might be hidden     2nd follow up call made.  NALM

## 2021-05-31 ENCOUNTER — Other Ambulatory Visit: Payer: Self-pay | Admitting: *Deleted

## 2021-05-31 DIAGNOSIS — J849 Interstitial pulmonary disease, unspecified: Secondary | ICD-10-CM

## 2021-06-24 ENCOUNTER — Ambulatory Visit
Admission: RE | Admit: 2021-06-24 | Discharge: 2021-06-24 | Disposition: A | Discharge status: Home / Self Care | Payer: Medicare Other | Source: Ambulatory Visit | Attending: Pulmonary Disease | Admitting: Pulmonary Disease

## 2021-06-24 DIAGNOSIS — J849 Interstitial pulmonary disease, unspecified: Secondary | ICD-10-CM

## 2021-06-29 ENCOUNTER — Other Ambulatory Visit: Payer: Medicare Other

## 2021-07-02 ENCOUNTER — Encounter: Payer: Self-pay | Admitting: Pulmonary Disease

## 2021-07-02 ENCOUNTER — Other Ambulatory Visit: Payer: Self-pay

## 2021-07-02 ENCOUNTER — Ambulatory Visit (INDEPENDENT_AMBULATORY_CARE_PROVIDER_SITE_OTHER): Payer: Medicare Other | Admitting: Pulmonary Disease

## 2021-07-02 VITALS — BP 126/70 | HR 68 | Temp 98.1°F | Ht 72.0 in | Wt 201.8 lb

## 2021-07-02 DIAGNOSIS — J849 Interstitial pulmonary disease, unspecified: Secondary | ICD-10-CM | POA: Diagnosis not present

## 2021-07-02 NOTE — Progress Notes (Signed)
Derek Cervantes    [id number]    07-Nov-1950  Primary Care Physician:Pryor, Orlando Penner, MD  Referring Physician: [name], MD [facility] [place] [address]  Chief complaint: Consult for dyspnea post COVID-72  HPI: 70 year old with history of PVCs, prostate cancer, allergies, postnasal drip, chronic cough Diagnosed with COVID-19 in January 2021.  He did not require hospitalizations.  Symptoms are mild and he recovered in a few weeks Complains of episodic dyspnea in the evenings since COVID-19.  Denies any wheezing, sputum production Feels that this may be stress related.  History notable for prostatectomy in 2010.  He developed mild dyspnea postprocedure with CT no abnormalities at that time. History of SVT status post ablation x2.  Follows with Dr. Caryl Comes He has chronic cough with postnasal drip and GERD for which he takes Protonix and Carafate  Pets: No pets Occupation: Dentist Exposures: Had mold in the basement around 2018.  This was remediated.  No ongoing exposures.  No hot tub, Jacuzzi.  No feather pillows or comforters Smoking history: Never smoker Travel history: No significant travel history.  Lives in [place] Relevant family history: No significant family history of lung disease  Interval history: Here for review of CT.  States that breathing is doing well with no issues.  He has occasional dyspnea with chest tightness which he attributes to anxiety.  This usually improves with slow breathing  Outpatient Encounter Medications as of 07/02/2021  Medication Sig   amoxicillin (AMOXIL) 500 MG capsule Take 500 mg by mouth 3 (three) times daily.   ascorbic acid (VITAMIN C) 500 MG tablet Take 500 mg by mouth daily.   metoprolol tartrate (LOPRESSOR) 25 MG tablet Take 0.5 tablets (12.5 mg total) by mouth daily as needed (palpitations or increased heart rate).   Multiple Vitamins-Minerals (ZINC PO) Take 1 tablet by mouth daily.   sucralfate (CARAFATE) 1 g  tablet Take by mouth as needed.   VITAMIN D PO Take 1 tablet by mouth daily.   Zinc Methionate 50 MG CAPS Take 1 capsule by mouth daily.   NITROSTAT 0.4 MG SL tablet Place 0.4 mg under the tongue every 5 (five) minutes as needed for chest pain. Take as needed (Patient not taking: Reported on 07/02/2021)   No facility-administered encounter medications on file as of 07/02/2021.   Physical Exam: Blood pressure 126/70, pulse 68, temperature 98.1 F (36.7 C), temperature source Oral, height 6' (1.829 m), weight 201 lb 12.8 oz (91.5 kg), SpO2 98 %. Gen:      No acute distress HEENT:  EOMI, sclera anicteric Neck:     No masses; no thyromegaly Lungs:    Clear to auscultation bilaterally; normal respiratory effort CV:         Regular rate and rhythm; no murmurs Abd:      + bowel sounds; soft, non-tender; no palpable masses, no distension Ext:    No edema; adequate peripheral perfusion Skin:      Warm and dry; no rash Neuro: alert and oriented x 3 Psych: normal mood and affect   Data Reviewed: Imaging: Chest x-ray 04/17/2020-minimal linear atelectasis at the left base.  High-res CT 06/19/2020-mild patchy groundglass and septal thickening at the lung base, minimal scarring secondary to osteophyte in the right lower lobe.  Aortic atherosclerosis.  CT chest 06/24/2021-minimal plan scarring overlying osteophyte.  Minimal groundglass in the right upper lobe.  No evidence of interstitial lung disease  I have  reviewed the images personally.  PFTs: 06/26/2020 FVC 3.65 [90%], FEV1 2.97 [86%], F/F 81, TLC 5.61 [76%], DLCO 23.35 [86%] Mild restriction  Labs:  Assessment:  Post COVID-19 Has mild symptoms of dyspnea CT reviewed with minimal nonspecific areas of groundglass attenuation and scarring around vertebral osteophyte.  This corresponds to mild restriction.  Follow-up CT reviewed with no change  Reviewed results with patient and reassured him that no treatment is needed at present. Continue  to maintain an active lifestyle with weight loss and exercise He can follow-up in pulmonary clinic as needed  Chronic cough Secondary to upper airway cough, GERD Continue Protonix, Carafate Follows with [facility]  Plan/Recommendations: Follow-up as needed  Marshell Garfinkel MD [facility] 07/02/2021, 9:35 AM  CC: [name], MD

## 2021-07-02 NOTE — Patient Instructions (Signed)
I have reviewed the CT which shows very minimal scarring which is likely secondary to osteophyte of the spine.  I do not see any residual scarring from COVID-19  Follow-up in pulmonary clinic as needed

## 2021-08-13 ENCOUNTER — Ambulatory Visit: Payer: Medicare Other | Admitting: Internal Medicine

## 2021-09-09 ENCOUNTER — Encounter: Payer: Self-pay | Admitting: Internal Medicine

## 2021-09-09 ENCOUNTER — Ambulatory Visit (INDEPENDENT_AMBULATORY_CARE_PROVIDER_SITE_OTHER): Payer: Medicare Other | Admitting: Internal Medicine

## 2021-09-09 ENCOUNTER — Other Ambulatory Visit: Payer: Self-pay

## 2021-09-09 VITALS — BP 110/70 | HR 73 | Ht 72.0 in | Wt 205.0 lb

## 2021-09-09 DIAGNOSIS — I493 Ventricular premature depolarization: Secondary | ICD-10-CM | POA: Diagnosis not present

## 2021-09-09 DIAGNOSIS — I452 Bifascicular block: Secondary | ICD-10-CM

## 2021-09-09 NOTE — Patient Instructions (Signed)

## 2021-09-09 NOTE — Progress Notes (Signed)
Patient Care Team: [name], MD as PCP - General (Internal Medicine) Deboraha Sprang, MD as PCP - Electrophysiology (Cardiology)   HPI  Derek Cervantes is a 71 y.o. male seen in followup for PVCs for which he underwent ablation at [facility] with Jerold Coombe   Has intercurrently done exceptionally well except for breakthrough 9/21 in the context of some GI illness.  Seen by AT-PA with interval resolution   He had a normal  Myoview scan 2010  Infrequent PVCs -- otherwise very well The patient denies chest pain, shortness of breath, nocturnal dyspnea, orthopnea or peripheral edema.  There have been no lightheadedness or syncope.   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   COVID-19 08/2019   Diverticulosis    Dyspnea    recumbent   ED (erectile dysfunction)    Limb tremor    hand tremors unassociated with palpitation   Osteoarthritis    Polio    as a child   PPD positive 1970   Prostate cancer ([place]) 2010   s/p robotic surgery - stage 3   Ventricular tachycardia    s/p ablation at the [place] clinic    Past Surgical History:  Procedure Laterality Date   CARDIAC ELECTROPHYSIOLOGY STUDY AND ABLATION     x 2   CARPAL TUNNEL RELEASE Right 2021   CATARACT EXTRACTION, BILATERAL     HIP ARTHROPLASTY Right    ROBOT ASSISTED LAPAROSCOPIC RADICAL PROSTATECTOMY     TONSILLECTOMY     TOTAL HIP ARTHROPLASTY Left     Current Outpatient Medications  Medication Sig Dispense Refill   ascorbic acid (VITAMIN C) 500 MG tablet Take 500 mg by mouth daily.     fluticasone (FLONASE) 50 MCG/ACT nasal spray as needed.     metoprolol tartrate (LOPRESSOR) 25 MG tablet Take 0.5 tablets (12.5 mg total) by mouth daily as needed (palpitations or increased heart rate). 30 tablet 1   Multiple Vitamins-Minerals (ZINC PO) Take 1 tablet by mouth daily.     NITROSTAT 0.4 MG SL tablet Place 0.4 mg under the tongue every 5 (five) minutes as needed for chest pain.  Take as needed     pantoprazole (PROTONIX) 40 MG tablet Take 40 mg by mouth as needed.     sucralfate (CARAFATE) 1 g tablet Take by mouth as needed.     VITAMIN D PO Take 1 tablet by mouth daily.     Zinc Methionate 50 MG CAPS Take 1 capsule by mouth daily.     No current facility-administered medications for this visit.    Allergies  Allergen Reactions   Flecainide Acetate     REACTION: intolerance   Propafenone Hcl     REACTION: intolerance   Sotalol Hcl     REACTION: intolerance    Review of Systems negative except from HPI and PMH  Physical Exam BP 110/70    Pulse 73    Ht 6' (1.829 m)    Wt 205 lb (93 kg)    SpO2 99%    BMI 27.80 kg/m  Well developed and well nourished in no acute distress HENT normal Neck supple with JVP-flat Clear Regular rate and rhythm, no  murmur Abd-soft with active BS No Clubbing cyanosis   edema Skin-warm and dry A & Oriented  Grossly normal sensory and motor function  ECG sinus @ 73 W/o PVC  Assessment and  Plan  PVC history of ablation at the [facility]  clinic  Right bundle branch/left posterior fascicular block   PVCs quiet

## 2022-10-31 ENCOUNTER — Encounter: Payer: Self-pay | Admitting: Internal Medicine

## 2022-10-31 ENCOUNTER — Ambulatory Visit: Payer: Medicare Other | Attending: Internal Medicine | Admitting: Internal Medicine

## 2022-10-31 VITALS — BP 112/60 | HR 70 | Ht 72.0 in | Wt 211.8 lb

## 2022-10-31 DIAGNOSIS — I493 Ventricular premature depolarization: Secondary | ICD-10-CM

## 2022-10-31 DIAGNOSIS — I452 Bifascicular block: Secondary | ICD-10-CM | POA: Diagnosis present

## 2022-10-31 NOTE — Progress Notes (Signed)
Patient Care Team: Jerene Pitch, MD as PCP - General (Internal Medicine) Duke Salvia, MD as PCP - Electrophysiology (Cardiology)   HPI  Derek Cervantes is a 72 y.o. male seen in followup for PVCs for which he underwent ablation at [facility] with Doylene Canning   Has intercurrently done exceptionally well except for breakthrough 9/21 in the context of some GI illness.  Seen by AT-PA with interval resolution   He had a normal  Myoview scan 2010  Infrequent PVCs -- otherwise very well The patient denies chest pain, shortness of breath, nocturnal dyspnea, orthopnea or peripheral edema.  There have been no lightheadedness or syncope.   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   COVID-19 08/2019   Diverticulosis    Dyspnea    recumbent   ED (erectile dysfunction)    Limb tremor    hand tremors unassociated with palpitation   Osteoarthritis    Polio    as a child   PPD positive 1970   Prostate cancer 2010   s/p robotic surgery - stage 3   Ventricular tachycardia    s/p ablation at the [place] [facility]    Past Surgical History:  Procedure Laterality Date   CARDIAC ELECTROPHYSIOLOGY STUDY AND ABLATION     x 2   CARPAL TUNNEL RELEASE Right 2021   CATARACT EXTRACTION, BILATERAL     HIP ARTHROPLASTY Right    ROBOT ASSISTED LAPAROSCOPIC RADICAL PROSTATECTOMY     TONSILLECTOMY     TOTAL HIP ARTHROPLASTY Left     Current Outpatient Medications  Medication Sig Dispense Refill   ascorbic acid (VITAMIN C) 500 MG tablet Take 500 mg by mouth daily.     fluticasone (FLONASE) 50 MCG/ACT nasal spray as needed.     metoprolol tartrate (LOPRESSOR) 25 MG tablet Take 0.5 tablets (12.5 mg total) by mouth daily as needed (palpitations or increased heart rate). 30 tablet 1   Multiple Vitamins-Minerals (ZINC PO) Take 1 tablet by mouth daily.     NITROSTAT 0.4 MG SL tablet Place 0.4 mg under the tongue every 5 (five) minutes as needed for chest pain. Take  as needed     pantoprazole (PROTONIX) 40 MG tablet Take 40 mg by mouth as needed.     sucralfate (CARAFATE) 1 g tablet Take by mouth as needed.     VITAMIN D PO Take 1 tablet by mouth daily.     Zinc Methionate 50 MG CAPS Take 1 capsule by mouth daily.     No current facility-administered medications for this visit.    Allergies  Allergen Reactions   Flecainide Acetate     REACTION: intolerance   Propafenone Hcl     REACTION: intolerance   Sotalol Hcl     REACTION: intolerance    Review of Systems negative except from HPI and PMH  Physical Exam BP 112/60   Pulse 70   Ht 6' (1.829 m)   Wt 211 lb 12.8 oz (96.1 kg)   SpO2 95%   BMI 28.73 kg/m  Well developed and well nourished in no acute distress HENT normal Neck supple with JVP-flat Clear Regular rate and rhythm, no  murmur Abd-soft with active BS No Clubbing cyanosis   edema Skin-warm and dry A & Oriented  Grossly normal sensory and motor function  ECG sinus at 70 Intervals 14/14/43 Right bundle branch block PVC Assessment and  Plan  PVC history of ablation at the [facility]  clinic right bundle branch block indeterminate axis  Right bundle branch/left posterior fascicular block   PVCs quiet Active Sold his practice finally now not sure what to do with himself --

## 2022-10-31 NOTE — Patient Instructions (Signed)
Medication Instructions:  Your physician recommends that you continue on your current medications as directed. Please refer to the Current Medication list given to you today.  *If you need a refill on your cardiac medications before your next appointment, please call your pharmacy*   Lab Work: None ordered.  If you have labs (blood work) drawn today and your tests are completely normal, you will receive your results only by: MyChart Message (if you have MyChart) OR A paper copy in the mail If you have any lab test that is abnormal or we need to change your treatment, we will call you to review the results.   Testing/Procedures: None ordered.    Follow-Up: At [facility], you and your health needs are our priority.  As part of our continuing mission to provide you with exceptional heart care, we have created designated Provider Care Teams.  These Care Teams include your primary Cardiologist (physician) and Advanced Practice Providers (APPs -  Physician Assistants and Nurse Practitioners) who all work together to provide you with the care you need, when you need it.  We recommend signing up for the patient portal called "MyChart".  Sign up information is provided on this After Visit Summary.  MyChart is used to connect with patients for Virtual Visits (Telemedicine).  Patients are able to view lab/test results, encounter notes, upcoming appointments, etc.  Non-urgent messages can be sent to your provider as well.   To learn more about what you can do with MyChart, go to [email or web address].    Your next appointment:   12 months with Dr Klein 

## 2023-01-01 IMAGING — CT CT CHEST W/O CM
1 series · 15 of 34 positions shown, 19 images · non-contrast
Comparison: 06/19/2020

CLINICAL DATA: Interstitial lung disease, history of COVID 0700

EXAM:
CT CHEST WITHOUT CONTRAST
TECHNIQUE: Multidetector CT imaging of the chest was performed following the
standard protocol without IV contrast.

[Series 2: chest w/(date) · axial · 0.81mm/px · z∈[-147,+169]mm · 15 of 186 slices shown, 19 images]
[im 14/186  mediastinal]
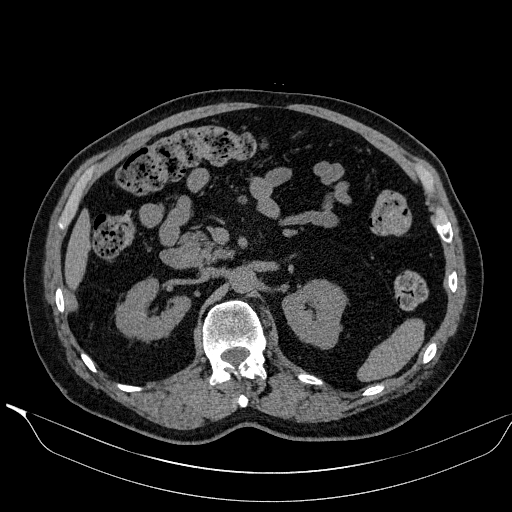
[im 14/186  lung]
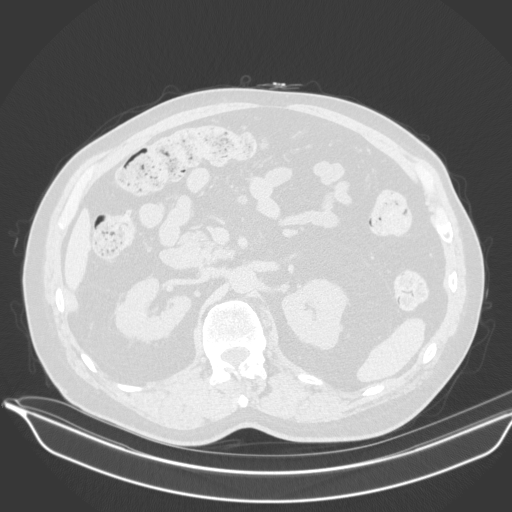
[im 28/186  lung]
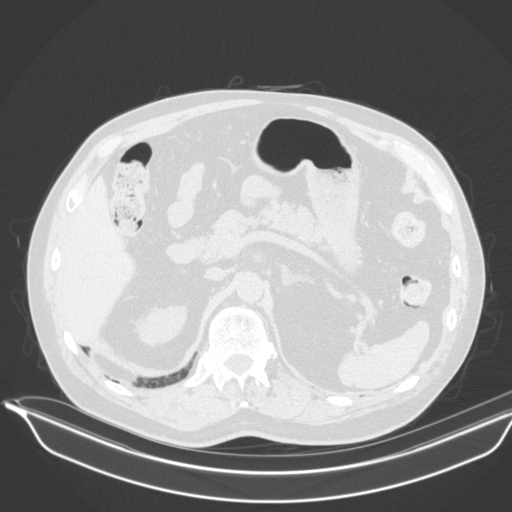
[im 38/186  lung]
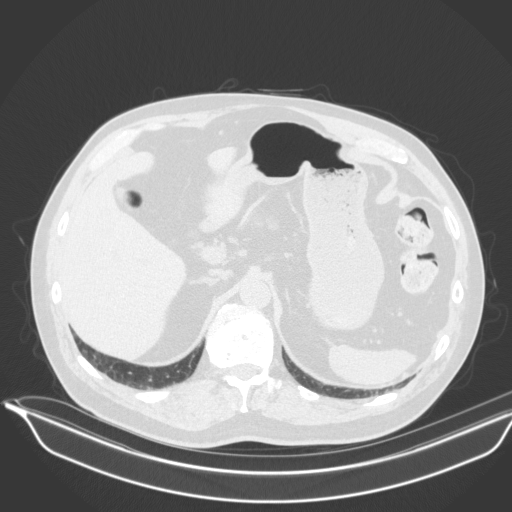
[im 48/186  lung]
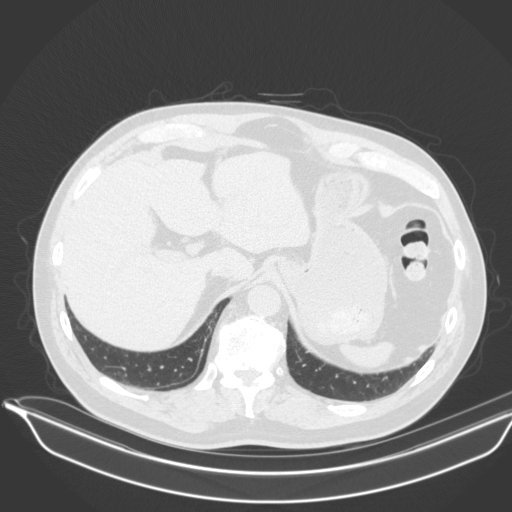
[im 62/186  mediastinal]
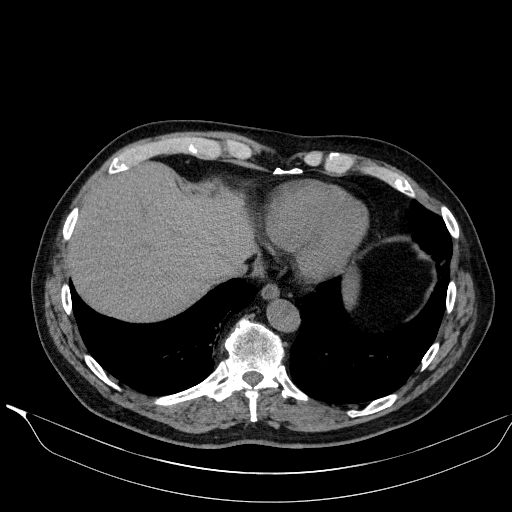
[im 62/186  lung]
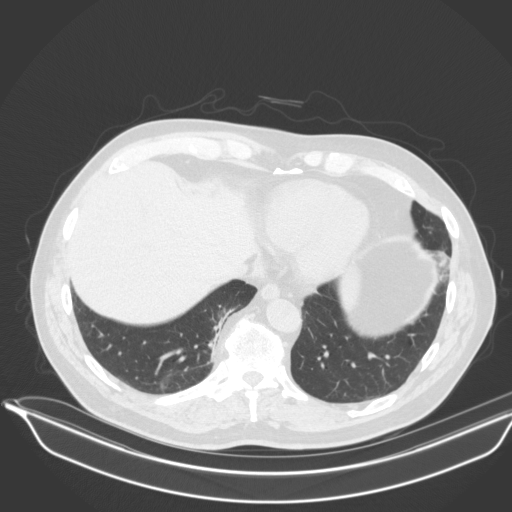
[im 75/186  lung]
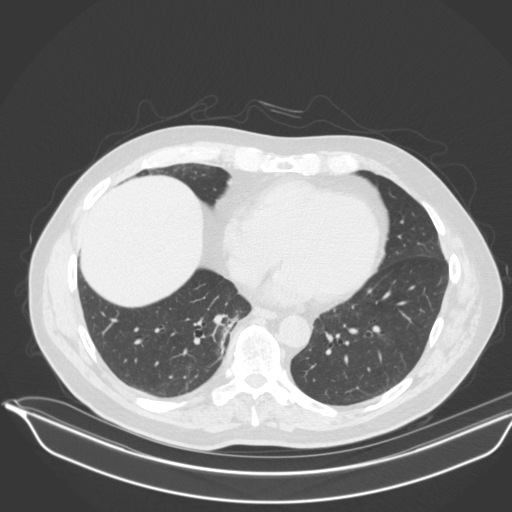
[im 83/186  lung]
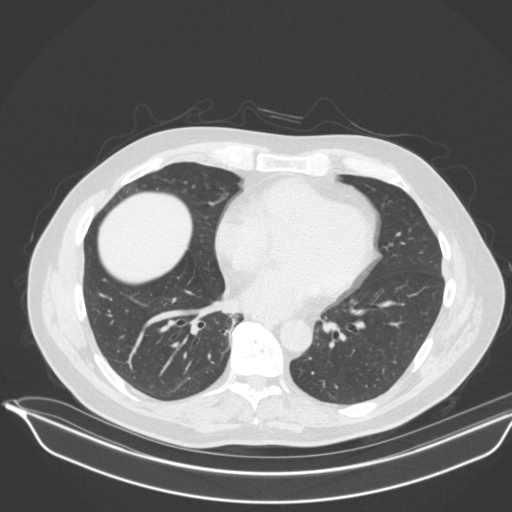
[im 96/186  lung]
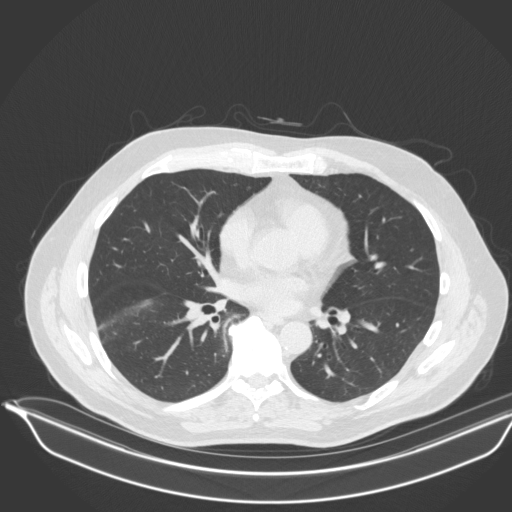
[im 103/186  mediastinal]
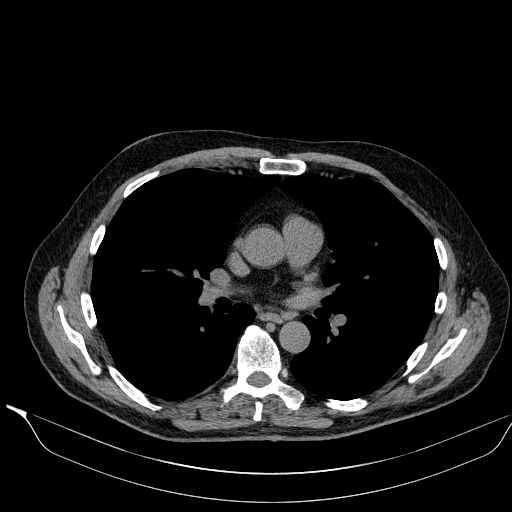
[im 103/186  lung]
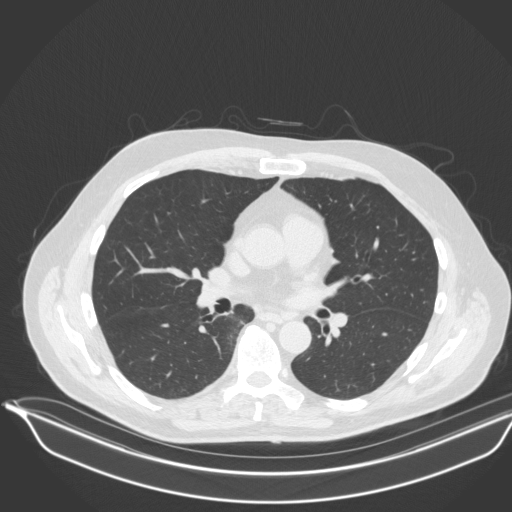
[im 112/186  lung]
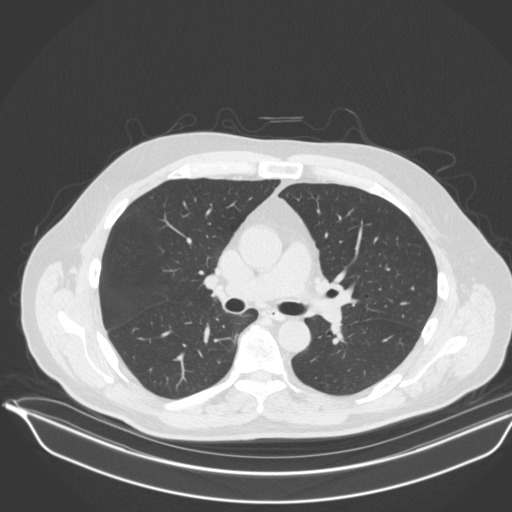
[im 124/186  lung]
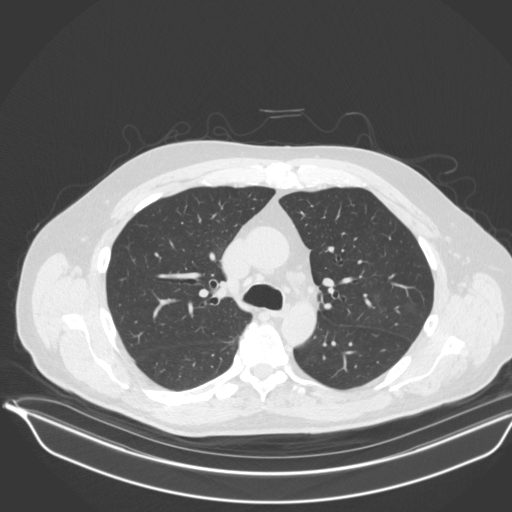
[im 138/186  lung]
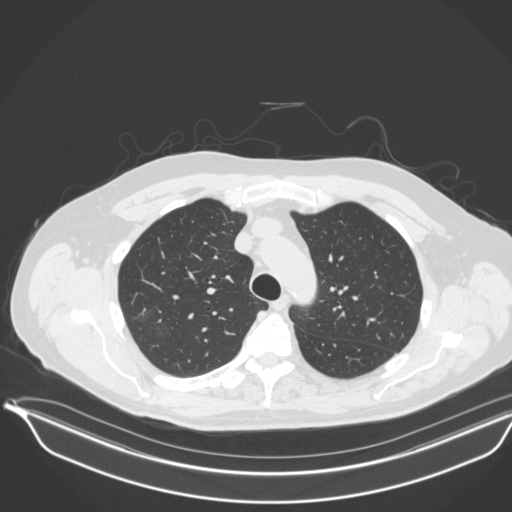
[im 149/186  mediastinal]
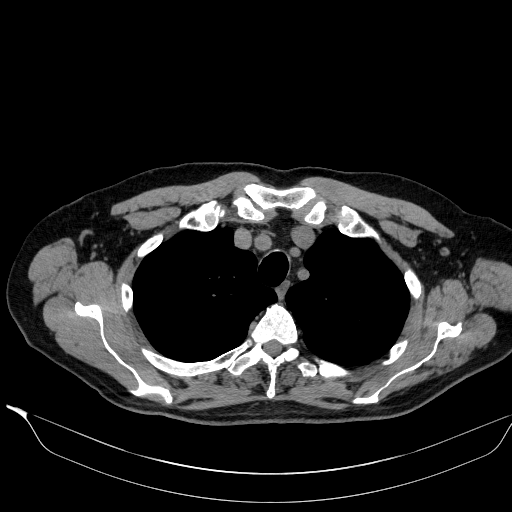
[im 149/186  lung]
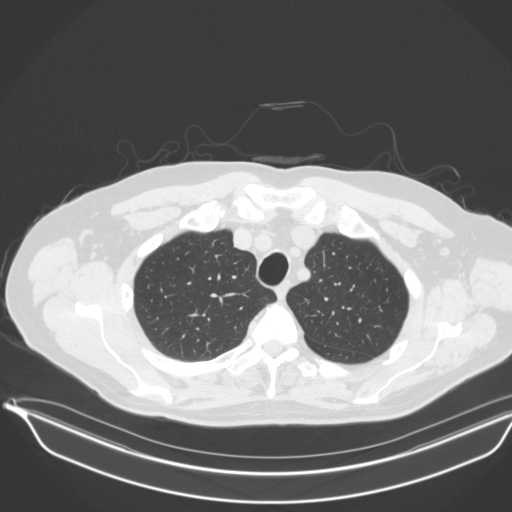
[im 158/186  lung]
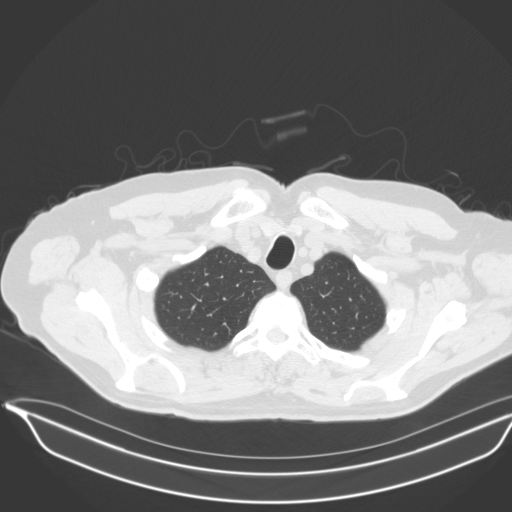
[im 172/186  lung]
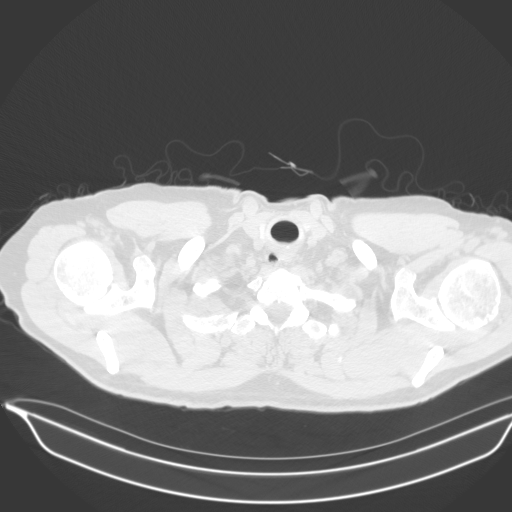

[15 of 34 positions shown; findings below may reference images not displayed]

FINDINGS: Cardiovascular: Aortic atherosclerosis. Normal heart size. Left
coronary artery calcifications. No pericardial effusion.

Mediastinum/Nodes: No enlarged mediastinal, hilar, or axillary lymph
nodes. Thyroid gland, trachea, and esophagus demonstrate no
significant findings.

Lungs/Pleura: No significant change in examination with minimal,
bland appearing bandlike scarring of the right lower lobe and
fibrosis overlying thoracic disc osteophytes (series 5, image 118).
Additional bandlike scarring of the posterior lingula (series 5,
image 115). There is minimal, unchanged irregular infectious or
inflammatory ground-glass of the peripheral right upper lobe (series
5, image 48). No pleural effusion or pneumothorax.

Upper Abdomen: No acute abnormality.

Musculoskeletal: No chest wall mass or suspicious bone lesions
identified.
IMPRESSION: 1. No significant change in examination with minimal, bland
appearing bandlike scarring as well as fibrosis overlying thoracic
disc osteophytes. Additional bandlike scarring of the posterior
lingula. There is minimal, unchanged irregular ground-glass of the
peripheral right upper lobe. No evidence of fibrotic interstitial
lung disease.
2. Coronary artery disease.

Aortic Atherosclerosis ([id number]).

## 2023-09-22 ENCOUNTER — Telehealth: Payer: Self-pay | Admitting: Internal Medicine

## 2023-09-22 NOTE — Telephone Encounter (Signed)
 PCP Faxed over EKG and Pt wants to know if it was abnormal

## 2023-09-22 NOTE — Telephone Encounter (Signed)
 Spoke with pt's wife, DPR and advised ECG was received from PCP (09/18/23) but there is not an EP provider available to read the ECG this afternoon.  PCP had advised pt ECG ws normal.  She reports pt was having some PVC's and that is why PCP did the ECG but pt is no longer having symptoms and would like to cancel appointment with Francis Dowse, PA-C scheduled for 09/29/2023 and just plan to keep appointment with Dr Graciela Husbands in April as scheduled.  Advised if symptoms return call for earlier appointment.  Pt's wife verbalizes understanding and thanked Charity fundraiser for the callback.

## 2023-09-29 ENCOUNTER — Ambulatory Visit: Payer: Medicare Other | Admitting: Physician Assistant

## 2023-09-29 ENCOUNTER — Encounter: Payer: Self-pay | Admitting: Internal Medicine

## 2023-10-09 ENCOUNTER — Telehealth: Payer: Self-pay | Admitting: Internal Medicine

## 2023-10-09 DIAGNOSIS — R002 Palpitations: Secondary | ICD-10-CM

## 2023-10-09 NOTE — Telephone Encounter (Signed)
 Called patient back about message. Patient stated last week him and his wife had an upper GI virus or food poison that possibly caused him to have irregular heart rhythms. Patient stated it had been going on for half the week and finally on Saturday morning he took 1/4 of his metoprolol 25 mg ( 6.25 mg ) He stated that his was 72  and dropped to 62. Patient stated he felt great for about 24 hours and then on Sunday the irregular heart rhythms came back. In prior messages [name] has discussed possible heart monitor. Will forward to [name] and his nurse and see if he would want to order a 30 day monitor. Also, patient stated he does not notice his palpitations has much when he is busy during the day. He said it mostly is noticeable at night when he is laying down. He would like to know if it is better for him to take the metoprolol at night.

## 2023-10-09 NOTE — Telephone Encounter (Signed)
 Patient c/o Palpitations:  STAT if patient reporting lightheadedness, shortness of breath, or chest pain  How long have you had palpitations/irregular HR/ Afib? Are you having the symptoms now?   No  Are you currently experiencing lightheadedness, SOB or CP?   No  Do you have a history of afib (atrial fibrillation) or irregular heart rhythm?   Yes  Have you checked your BP or HR? (document readings if available):   BP 137/78  HR 72 yesterday morning  Are you experiencing any other symptoms?   No  Patient is concerned he has been having palpitations which started on Friday and happened again yesterday (Sunday, 3/23).

## 2023-10-10 ENCOUNTER — Ambulatory Visit: Attending: Internal Medicine

## 2023-10-10 DIAGNOSIS — R002 Palpitations: Secondary | ICD-10-CM

## 2023-10-10 NOTE — Progress Notes (Unsigned)
 Enrolled for [employer] to mail a ZIO XT long term holter monitor to the patients address on file.

## 2023-10-10 NOTE — Telephone Encounter (Signed)
 Attempted PC to pt.  Left voicemail message (ok per EPIC) advising patient Dr Graciela Husbands would like for him to wear a 14 day heart monitor.  Monitor will arrive in the mail in about 3-4 days.  Instructions have been placed on MyCHart.  Please call [phone] for any further questions.

## 2023-11-09 ENCOUNTER — Encounter: Payer: Self-pay | Admitting: Internal Medicine

## 2023-11-09 ENCOUNTER — Telehealth: Payer: Self-pay | Admitting: Internal Medicine

## 2023-11-09 NOTE — Telephone Encounter (Signed)
 Patient's wife Derek Cervantes states patient's appt with Dr. Rodolfo Clan on 11/13/23 was cancelled due to provider going out on medical leave. An appt was made to see Derek Adolphus, PA-C on 5/28.  Derek Cervantes is concerned because they are still awaiting heart monitor results and patient is still having issues per her report. She does not feel he can wait another month to see a provider.  Derek Cervantes states she just sent MyChart message to [facility] with this same information. MyChart message forwarded to [facility].  Will forward to Dr. Doyle Generous nurse to follow-up with patient and spouse.

## 2023-11-09 NOTE — Telephone Encounter (Signed)
This has been addressed in another encounter.  See that encounter for complete details.

## 2023-11-09 NOTE — Telephone Encounter (Signed)
 Pt spouse called in concerned about pt's appt being cancelled and asked to speak with a nurse.

## 2023-11-10 NOTE — Telephone Encounter (Signed)
 I called the patient since he wife wrote in about chest pressure and significantly increased palpitations.  He said he's tried pepcid intermittently. He notices the pressure mostly after dinner and the palpitations are throughout the day and while having the chest pressure.  He refers to it as bigeminy and trigeminy.   Has no other symptoms  denies chest pain and shortness of breath.  Has not tried NTG.  He is visiting his grandchildren in [place] and does all activities without exacerbation of symptoms.    He took a 1/4 of an old metoprolol  25 mg that was prescribed in 2021 and it helped.

## 2023-11-13 ENCOUNTER — Ambulatory Visit: Payer: Medicare Other | Admitting: Internal Medicine

## 2023-11-13 DIAGNOSIS — R002 Palpitations: Secondary | ICD-10-CM

## 2023-11-13 NOTE — Telephone Encounter (Signed)
 Preliminary results of monitor do not show frequent PVCs (<1%) but he is having occasional SVT that may correlate to his symptoms.   Would offer him Toprol  XL 12.5 mg daily for long acting treatment, and refill his lopressor  to use for breakthrough palpitations.   If he continues to have issues prior to his appt can call back and we can increase further if needed.  Tylene Galla, PA-C

## 2023-12-12 NOTE — Progress Notes (Unsigned)
  Electrophysiology Office Note:   Date:  12/13/2023  ID:  Derek Cervantes, DOB 1951-04-28, MRN [id number]  Primary Cardiologist: None Electrophysiologist: Richardo Chandler, MD      History of Present Illness:   Derek Cervantes is a 73 y.o. male with h/o PVCs s/p ablation and RBBB/LPFB seen today for routine electrophysiology followup.   Since last being seen in our clinic the patient reports doing well. Once his GI illness settled down, his palpitations also resolved. He did take lopressor  during the episodes with good results. Otherwise, he denies chest pain, palpitations, dyspnea, PND, orthopnea, nausea, vomiting, dizziness, syncope, edema, weight gain, or early satiety.   Review of systems complete and found to be negative unless listed in HPI.   EP Information / Studies Reviewed:    EKG is ordered today. Personal review as below.  EKG Interpretation Date/Time:  Wednesday Dec 13 2023 08:24:53 EDT Ventricular Rate:  55 PR Interval:  140 QRS Duration:  136 QT Interval:  442 QTC Calculation: 422 R Axis:   168  Text Interpretation: Sinus bradycardia Indeterminate axis Right bundle branch block When compared with ECG of 06-Apr-2007 06:12, No significant change was found Confirmed by Pilar Bridge [phone]) on 12/13/2023 8:29:20 AM    Arrhythmia/Device History PVC ablation at [facility] x 2   Physical Exam:   VS:  BP 118/70   Pulse (!) 55   Ht 6' (1.829 m)   Wt 199 lb 6.4 oz (90.4 kg)   SpO2 96%   BMI 27.04 kg/m    Wt Readings from Last 3 Encounters:  12/13/23 199 lb 6.4 oz (90.4 kg)  10/31/22 211 lb 12.8 oz (96.1 kg)  09/09/21 205 lb (93 kg)     GEN: No acute distress NECK: No JVD; No carotid bruits CARDIAC: Regular rate and rhythm, no murmurs, rubs, gallops RESPIRATORY:  Clear to auscultation without rales, wheezing or rhonchi  ABDOMEN: Soft, non-tender, non-distended EXTREMITIES:  No edema; No deformity   ASSESSMENT AND PLAN:    PVCs S/p prior ablation at [facility] EKG today shows NSR with stable conduction disease as below.  Continue lopressor  prn  RBBB LPFB No worsening, if requires more BB for palpitations will need to follow closely.    Follow up with EP APP in 12 months  Signed, Tylene Galla, PA-C

## 2023-12-13 ENCOUNTER — Encounter: Payer: Self-pay | Admitting: Student

## 2023-12-13 ENCOUNTER — Ambulatory Visit: Attending: Student | Admitting: Student

## 2023-12-13 VITALS — BP 118/70 | HR 55 | Ht 72.0 in | Wt 199.4 lb

## 2023-12-13 DIAGNOSIS — I493 Ventricular premature depolarization: Secondary | ICD-10-CM | POA: Insufficient documentation

## 2023-12-13 DIAGNOSIS — I452 Bifascicular block: Secondary | ICD-10-CM | POA: Insufficient documentation

## 2023-12-13 MED ORDER — METOPROLOL TARTRATE 25 MG PO TABS: 12.5000 mg | ORAL_TABLET | Freq: Every day | ORAL | 1 refills | Status: AC | PRN

## 2023-12-13 NOTE — Patient Instructions (Addendum)
 Medication Instructions:  No medication changes today. *If you need a refill on your cardiac medications before your next appointment, please call your pharmacy*  Lab Work: No labwork ordered today. If you have labs (blood work) drawn today and your tests are completely normal, you will receive your results only by: MyChart Message (if you have MyChart) OR A paper copy in the mail If you have any lab test that is abnormal or we need to change your treatment, we will call you to review the results.  Testing/Procedures: No testing ordered today  Follow-Up: At [facility], you and your health needs are our priority.  As part of our continuing mission to provide you with exceptional heart care, our providers are all part of one team.  This team includes your primary Cardiologist (physician) and Advanced Practice Providers or APPs (Physician Assistants and Nurse Practitioners) who all work together to provide you with the care you need, when you need it.  Your next appointment:   12 month(s)  Provider:   You may see Dr. Daneil Dunker or one of the following Advanced Practice Providers on your designated Care Team:   Mertha Abrahams, PA-C Bambi Lever "[place]" [place], PA-C Suzann Riddle, NP Creighton Doffing, NP    We recommend signing up for the patient portal called "MyChart".  Sign up information is provided on this After Visit Summary.  MyChart is used to connect with patients for Virtual Visits (Telemedicine).  Patients are able to view lab/test results, encounter notes, upcoming appointments, etc.  Non-urgent messages can be sent to your provider as well.   To learn more about what you can do with MyChart, go to [email or web address].

## 2024-01-03 ENCOUNTER — Telehealth: Payer: Self-pay

## 2024-01-03 NOTE — Telephone Encounter (Signed)
 Called and spoke's spouse, Melva(DPR). Melva states that patient has had a dry cough for two months.  SOB is baseline.  Denied, wheezing, f/c/s Melva would like pt to be seen on 01/11/2024, as they will be in town that day for another appt.  Pt is taking Claritin without relief.  Two rounds of abx.  Dr. Waylan Haggard, please advise if okay to schedule 01/11/2024 in held slot?

## 2024-01-03 NOTE — Telephone Encounter (Signed)
 Copied from CRM [id number]. Topic: Appointments - Scheduling Inquiry for Clinic >> Jan 01, 2024 12:41 PM Ilean Mall F wrote: Reason for CRM: Pt has acute sx of chronic cough for about two months. Pt's spouse Melva is calling regarding setting up an appt. I attempted to schedule in the next couple days offering other practice providers and locations, but the pt would like to be seen at the [facility] clinic next week. Pt's spouse stated they would be in [place] on 6/26 around 10 am and would like to see if there could be an opening on this date. Please call back at [phone].   Lm for patient.

## 2024-01-04 NOTE — Telephone Encounter (Signed)
 Okay to schedule if there is an opening.

## 2024-01-04 NOTE — Telephone Encounter (Signed)
 Appointment has been scheduled.

## 2024-01-04 NOTE — Telephone Encounter (Signed)
 Spoke to patient's spouse, Melva(DPR) and offered appt for 01/11/2024 at 3:00.  Toy Freund, please schedule. I am unable to do so as it is a held slot. Thanks

## 2024-01-11 ENCOUNTER — Encounter: Payer: Self-pay | Admitting: Pulmonary Disease

## 2024-01-11 ENCOUNTER — Ambulatory Visit: Admitting: Pulmonary Disease

## 2024-01-11 VITALS — BP 102/60 | HR 62 | Ht 72.0 in | Wt 204.0 lb

## 2024-01-11 DIAGNOSIS — R053 Chronic cough: Secondary | ICD-10-CM | POA: Diagnosis not present

## 2024-01-11 NOTE — Progress Notes (Signed)
 Derek Cervantes    [id number]    Nov 09, 1950  Primary Care Physician:Pryor, Lamar BRAVO, MD  Referring Physician: Liane Lamar BRAVO, MD [address] [place],  [place] [address]  Chief complaint: Consult for dyspnea post COVID-23  HPI: 73 year old with history of PVCs, prostate cancer, allergies, postnasal drip, chronic cough Diagnosed with COVID-19 in January 2021.  He did not require hospitalizations.  Symptoms are mild and he recovered in a few weeks Complains of episodic dyspnea in the evenings since COVID-19.  Denies any wheezing, sputum production Feels that this may be stress related.  History notable for prostatectomy in 2010.  He developed mild dyspnea postprocedure with CT no abnormalities at that time. History of SVT status post ablation x2.  Follows with Dr. Fernande He has chronic cough with postnasal drip and GERD for which he takes Protonix and Carafate  Pets: No pets Occupation: Retired Education officer, community Exposures: Had mold in the basement around 2018.  This was remediated.  No ongoing exposures.  No hot tub, Jacuzzi.  No feather pillows or comforters Smoking history: Never smoker Travel history: No significant travel history.  Lives in [place]  Relevant family history: No significant family history of lung disease  Interval history: Discussed the use of AI scribe software for clinical note transcription with the patient, who gave verbal consent to proceed.  History of Present Illness Derek Cervantes is a 73 year old male who presents with a chronic cough.  Since March 2025, he has experienced a chronic cough with clear mucus production, described as 'URI going out of drainage'. The cough has been intermittent, with periods of improvement followed by recurrence. He initially self-treated with a Z-Pak, which provided temporary relief, and later tried amoxicillin , which also temporarily resolved the symptoms. The cough returned about a month ago and persisted until two or  three days ago when it began to improve. Currently, there is less drainage and a reduction in coughing frequency to two or three times a day. No chest pain or significant shortness of breath. The cough does not disturb his sleep.  He has a history of sinus issues and acid reflux and has been prescribed Flonase, Protonix, and sucralfate, but does not use them regularly. He has tried various antihistamines, including Sudafed 30 mg, taken two to three times a day, which helps with congestion.  In March 2025, he visited his PCP when the symptoms first started, and a chest X-ray was performed, which was negative. He had COVID-19 in 2021, and a follow-up CT scan at that time showed mild nonspecific changes.  He works part-time as a Education officer, community and is involved in Agricultural consultant at [employer]. He also assists in caring for his wife, who has medical issues including vision loss in one eye.    Outpatient Encounter Medications as of 01/11/2024  Medication Sig   ascorbic acid (VITAMIN C) 500 MG tablet Take 500 mg by mouth daily.   fluticasone (FLONASE) 50 MCG/ACT nasal spray as needed.   metoprolol  tartrate (LOPRESSOR ) 25 MG tablet Take 0.5 tablets (12.5 mg total) by mouth daily as needed (palpitations or increased heart rate).   Multiple Vitamins-Minerals (ZINC PO) Take 1 tablet by mouth daily.   NITROSTAT 0.4 MG SL tablet Place 0.4 mg under the tongue every 5 (five) minutes as needed for chest pain. Take as needed   pantoprazole (PROTONIX) 40 MG tablet Take 40 mg by mouth as needed.   sucralfate (CARAFATE) 1  g tablet Take by mouth as needed.   VITAMIN D PO Take 1 tablet by mouth daily.   Zinc Methionate 50 MG CAPS Take 1 capsule by mouth daily.   No facility-administered encounter medications on file as of 01/11/2024.   Physical Exam: Blood pressure 126/70, pulse 68, temperature 98.1 F (36.7 C), temperature source Oral, height 6' (1.829 m), weight 201 lb 12.8 oz (91.5 kg), SpO2 98 %. Gen:      No acute  distress HEENT:  EOMI, sclera anicteric Neck:     No masses; no thyromegaly Lungs:    Clear to auscultation bilaterally; normal respiratory effort CV:         Regular rate and rhythm; no murmurs Abd:      + bowel sounds; soft, non-tender; no palpable masses, no distension Ext:    No edema; adequate peripheral perfusion Skin:      Warm and dry; no rash Neuro: alert and oriented x 3 Psych: normal mood and affect   Data Reviewed: Imaging: Chest x-ray 04/17/2020-minimal linear atelectasis at the left base.  High-res CT 06/19/2020-mild patchy groundglass and septal thickening at the lung base, minimal scarring secondary to osteophyte in the right lower lobe.  Aortic atherosclerosis.  CT chest 06/24/2021-minimal plan scarring overlying osteophyte.  Minimal groundglass in the right upper lobe.  No evidence of interstitial lung disease   CXR report from PCP 09/27/2023-no acute cardiopulmonary abnormality I have reviewed the images personally.  PFTs: 06/26/2020 FVC 3.65 [90%], FEV1 2.97 [86%], F/F 81, TLC 5.61 [76%], DLCO 23.35 [86%] Mild restriction  Labs:  Assessment & Plan Chronic cough with post-nasal drip Chronic cough since March 2025, associated with post-nasal drip and clear mucus production. Symptoms have fluctuated, with recent improvement in cough but persistent congestion. Previous treatments with azithromycin and amoxicillin  provided temporary relief. Current symptoms suggest upper respiratory tract involvement, likely post-nasal drip. No significant findings on chest X-ray from March 2025. Differential includes sinus-related etiology. No current evidence of interstitial lung disease based on previous CT scan findings. - Initiate chlorpheniramine, 4 mg, 1-2 tablets three times a day, cautioning about potential drowsiness. - Resume regular use of Flonase (or Nasonex) nasal spray daily. - If no improvement, contact provider for potential CT scan order. - Avoid further antibiotic use  unless symptoms worsen or change in character.  Acid reflux Acid reflux managed with Protonix and sucralfate as needed. No recent exacerbation reported. - Continue Protonix and sucralfate as needed for acid reflux management.  History of COVID-19 COVID-19 in 2021 with follow-up CT reviewed with minimal nonspecific areas of groundglass attenuation and scarring around vertebral osteophyte.  This corresponds to mild restriction.  There was no evidence of interstitial lung disease.     Plan/Recommendations: Flonase, over-the-counter chlorphentermine Continue antiacid medication  Lonna Coder MD [facility] 01/11/2024, 3:13 PM  CC: Liane Lamar BRAVO, MD

## 2024-01-11 NOTE — Patient Instructions (Signed)
 VISIT SUMMARY:  During your visit, we discussed your chronic cough, which has been ongoing since March 2025. You have experienced intermittent symptoms with periods of improvement and recurrence. Your cough has recently improved, but you still have some congestion. We also reviewed your history of sinus issues, acid reflux, and past COVID-19 infection.  YOUR PLAN:  -CHRONIC COUGH WITH POST-NASAL DRIP: Your chronic cough is likely due to post-nasal drip, which is when mucus from your nose drips down the back of your throat, causing irritation and coughing. We recommend starting chlorpheniramine, 4 mg, taking 1-2 tablets three times a day, but be aware it may cause drowsiness. Please resume using Flonase (or Nasonex) nasal spray daily. If your symptoms do not improve, contact us  for a potential CT scan. Avoid using antibiotics unless your symptoms worsen or change significantly.  -ACID REFLUX: Acid reflux occurs when stomach acid flows back into your esophagus, causing discomfort. Continue taking Protonix and sucralfate as needed to manage your acid reflux symptoms.  -HISTORY OF COVID-19: You had COVID-19 in 2021, and a follow-up CT scan showed mild nonspecific changes. There are no current respiratory symptoms related to your past COVID-19 infection.  INSTRUCTIONS:  If your chronic cough does not improve with the current treatment plan, please contact us  for a potential CT scan. Continue taking your medications as prescribed and avoid using antibiotics unless advised.

## 2024-06-18 ENCOUNTER — Ambulatory Visit: Admitting: Cardiology
